# Patient Record
Sex: Female | Born: 1960 | Race: Black or African American | Hispanic: No | Marital: Single | Smoking: Current every day smoker
Health system: Southern US, Community
[De-identification: ages and names within clinical notes are randomized; demographics above are authoritative.]

## PROBLEM LIST (undated history)

## (undated) DIAGNOSIS — M1990 Unspecified osteoarthritis, unspecified site: Secondary | ICD-10-CM

## (undated) DIAGNOSIS — N809 Endometriosis, unspecified: Secondary | ICD-10-CM

## (undated) DIAGNOSIS — D649 Anemia, unspecified: Secondary | ICD-10-CM

## (undated) DIAGNOSIS — I10 Essential (primary) hypertension: Secondary | ICD-10-CM

## (undated) HISTORY — PX: COLONOSCOPY: SHX174

## (undated) HISTORY — PX: CHOLECYSTECTOMY: SHX55

## (undated) HISTORY — PX: ABDOMINAL HYSTERECTOMY: SHX81

## (undated) HISTORY — DX: Endometriosis, unspecified: N80.9

## (undated) HISTORY — DX: Anemia, unspecified: D64.9

## (undated) HISTORY — DX: Unspecified osteoarthritis, unspecified site: M19.90

---

## 1998-05-18 ENCOUNTER — Other Ambulatory Visit: Admission: RE | Admit: 1998-05-18 | Discharge: 1998-05-18 | Payer: Self-pay | Admitting: Gynecology

## 1999-03-22 ENCOUNTER — Other Ambulatory Visit: Admission: RE | Admit: 1999-03-22 | Discharge: 1999-03-22 | Payer: Self-pay | Admitting: Gynecology

## 1999-08-09 ENCOUNTER — Emergency Department (HOSPITAL_COMMUNITY): Admission: EM | Admit: 1999-08-09 | Discharge: 1999-08-09 | Payer: Self-pay | Admitting: Emergency Medicine

## 2000-03-27 ENCOUNTER — Other Ambulatory Visit: Admission: RE | Admit: 2000-03-27 | Discharge: 2000-03-27 | Payer: Self-pay | Admitting: Obstetrics and Gynecology

## 2000-11-26 ENCOUNTER — Encounter (INDEPENDENT_AMBULATORY_CARE_PROVIDER_SITE_OTHER): Payer: Self-pay

## 2000-11-26 ENCOUNTER — Ambulatory Visit (HOSPITAL_COMMUNITY): Admission: RE | Admit: 2000-11-26 | Discharge: 2000-11-26 | Payer: Self-pay | Admitting: Obstetrics and Gynecology

## 2001-04-03 ENCOUNTER — Other Ambulatory Visit: Admission: RE | Admit: 2001-04-03 | Discharge: 2001-04-03 | Payer: Self-pay | Admitting: Obstetrics and Gynecology

## 2001-05-08 ENCOUNTER — Encounter (INDEPENDENT_AMBULATORY_CARE_PROVIDER_SITE_OTHER): Payer: Self-pay | Admitting: Specialist

## 2001-05-08 ENCOUNTER — Inpatient Hospital Stay (HOSPITAL_COMMUNITY): Admission: RE | Admit: 2001-05-08 | Discharge: 2001-05-10 | Payer: Self-pay | Admitting: Obstetrics and Gynecology

## 2005-08-23 ENCOUNTER — Ambulatory Visit: Payer: Self-pay | Admitting: Gastroenterology

## 2005-09-03 ENCOUNTER — Ambulatory Visit: Payer: Self-pay | Admitting: Gastroenterology

## 2005-09-03 ENCOUNTER — Encounter (INDEPENDENT_AMBULATORY_CARE_PROVIDER_SITE_OTHER): Payer: Self-pay | Admitting: *Deleted

## 2006-04-23 ENCOUNTER — Emergency Department (HOSPITAL_COMMUNITY): Admission: EM | Admit: 2006-04-23 | Discharge: 2006-04-23 | Payer: Self-pay | Admitting: Emergency Medicine

## 2006-09-03 ENCOUNTER — Emergency Department (HOSPITAL_COMMUNITY): Admission: EM | Admit: 2006-09-03 | Discharge: 2006-09-03 | Payer: Self-pay | Admitting: Emergency Medicine

## 2006-11-13 ENCOUNTER — Emergency Department (HOSPITAL_COMMUNITY): Admission: EM | Admit: 2006-11-13 | Discharge: 2006-11-13 | Payer: Self-pay | Admitting: Family Medicine

## 2007-02-18 ENCOUNTER — Other Ambulatory Visit: Admission: RE | Admit: 2007-02-18 | Discharge: 2007-02-18 | Payer: Self-pay | Admitting: Obstetrics and Gynecology

## 2007-02-24 ENCOUNTER — Encounter: Admission: RE | Admit: 2007-02-24 | Discharge: 2007-02-24 | Payer: Self-pay | Admitting: Obstetrics and Gynecology

## 2007-03-05 ENCOUNTER — Encounter: Admission: RE | Admit: 2007-03-05 | Discharge: 2007-03-05 | Payer: Self-pay | Admitting: Obstetrics and Gynecology

## 2008-07-26 ENCOUNTER — Encounter (INDEPENDENT_AMBULATORY_CARE_PROVIDER_SITE_OTHER): Payer: Self-pay | Admitting: Emergency Medicine

## 2008-07-26 ENCOUNTER — Emergency Department (HOSPITAL_COMMUNITY): Admission: EM | Admit: 2008-07-26 | Discharge: 2008-07-26 | Payer: Self-pay | Admitting: Emergency Medicine

## 2008-07-26 ENCOUNTER — Ambulatory Visit (HOSPITAL_COMMUNITY): Admission: RE | Admit: 2008-07-26 | Discharge: 2008-07-26 | Payer: Self-pay | Admitting: Emergency Medicine

## 2008-07-26 ENCOUNTER — Ambulatory Visit: Payer: Self-pay | Admitting: Vascular Surgery

## 2008-08-24 ENCOUNTER — Encounter: Admission: RE | Admit: 2008-08-24 | Discharge: 2008-08-24 | Payer: Self-pay | Admitting: Obstetrics and Gynecology

## 2008-08-30 ENCOUNTER — Emergency Department (HOSPITAL_COMMUNITY): Admission: EM | Admit: 2008-08-30 | Discharge: 2008-08-30 | Payer: Self-pay | Admitting: Emergency Medicine

## 2008-08-30 ENCOUNTER — Other Ambulatory Visit: Admission: RE | Admit: 2008-08-30 | Discharge: 2008-08-30 | Payer: Self-pay | Admitting: Obstetrics and Gynecology

## 2009-08-03 ENCOUNTER — Encounter (INDEPENDENT_AMBULATORY_CARE_PROVIDER_SITE_OTHER): Payer: Self-pay | Admitting: *Deleted

## 2009-11-21 ENCOUNTER — Encounter (INDEPENDENT_AMBULATORY_CARE_PROVIDER_SITE_OTHER): Payer: Self-pay | Admitting: *Deleted

## 2009-11-22 ENCOUNTER — Ambulatory Visit: Payer: Self-pay | Admitting: Internal Medicine

## 2009-12-06 ENCOUNTER — Ambulatory Visit: Payer: Self-pay | Admitting: Internal Medicine

## 2010-10-28 ENCOUNTER — Encounter: Payer: Self-pay | Admitting: Obstetrics and Gynecology

## 2010-10-29 ENCOUNTER — Encounter: Payer: Self-pay | Admitting: Obstetrics and Gynecology

## 2010-11-06 NOTE — Procedures (Signed)
Summary: Colonoscopy  Patient: Yvette Davis Note: All result statuses are Final unless otherwise noted.  Tests: (1) Colonoscopy (COL)   COL Colonoscopy           DONE     [facility]     [address]     [place], [place]  [address]           COLONOSCOPY PROCEDURE REPORT           PATIENT:  Yvette Davis, Yvette Davis  MR#:  [id number]     BIRTHDATE:  1961-05-27, 48 yrs. old  GENDER:  female           ENDOSCOPIST:  Iva Boop, MD, [facility]     Referred by:  Surveillance Program Recall,           PROCEDURE DATE:  12/06/2009     PROCEDURE:  Colonoscopy with snare polypectomy     ASA CLASS:  Class II     INDICATIONS:  history of pre-cancerous (adenomatous) colon polyps,     screening, family history of colon cancer 2005: advanced adenomas     (2-3)and endometriosis (polyp)     2006 no adenomas (diminutive hyperplastic polyps)           maternal aunt and uncle with colon cancer           MEDICATIONS:   Fentanyl 50 mcg IV, Versed 5 mg IV           DESCRIPTION OF PROCEDURE:   After the risks benefits and     alternatives of the procedure were thoroughly explained, informed     consent was obtained.  Digital rectal exam was performed and     revealed no abnormalities.   The LB CF-H180AL [id number] endoscope     was introduced through the anus and advanced to the cecum, which     was identified by both the appendix and ileocecal valve, without     limitations.  The quality of the prep was excellent, using     MoviPrep.  The instrument was then slowly withdrawn as the colon     was fully examined.     Insertion: 2:45 minutes withdrawal: 12:38 minutes     <<PROCEDUREIMAGES>>           FINDINGS:  A sessile polyp was found in the sigmoid colon. It was     8 mm in size. Firm polyp. Polyp was snared, then cauterized with     monopolar cautery. Retrieval was successful. snare polyp  A     diminutive polyp was found in the rectum. Polyp was snared without     cautery. Retrieval was  successful. snare polyp  Mild     diverticulosis was found throughout the colon.  This was otherwise     a normal examination of the colon. Right colon retroflex also     normal.   Retroflexed views in the rectum revealed no     abnormalities.    The scope was then withdrawn from the patient     and the procedure completed.           COMPLICATIONS:  None           ENDOSCOPIC IMPRESSION:     1) 8 mm sessile polyp in the sigmoid colon - removed     2) Diminutive polyp in the rectum - removed     3) Mild diverticulosis throughout the colon     4) Otherwise normal examination,excellent prep  5) Prior advanced adenomas 2005 and family hx of colon cancer     (aunt and uncle - maternal)           RECOMMENDATIONS: Take Align 1 each day for gas, bloating. Take for     1 month and stop. restart if it was effective and symproms recur.     schedule GI office visit with Dr. Leone Payor if needed.           REPEAT EXAM:  In for Colonoscopy, pending biopsy results.           Iva Boop, MD, [facility]           CC:  Georgann Housekeeper MD     Artist Pais MD     The Patient           n.     Rosalie Doctor:   Iva Boop at 12/06/2009 09:21 AM           Page 2 of 3   Yvette Davis, Yvette Davis, [id number]  Note: An exclamation mark (!) indicates a result that was not dispersed into the flowsheet. Document Creation Date: 12/06/2009 9:22 AM _______________________________________________________________________  (1) Order result status: Final Collection or observation date-time: 12/06/2009 09:07 Requested date-time:  Receipt date-time:  Reported date-time:  Referring Physician:   Ordering Physician: Stan Head [id number]) Specimen Source:  Source: Launa Grill Order Number: [id number] Lab site:   Appended Document: Colonoscopy     Procedures Next Due Date:    Colonoscopy: 12/2014

## 2010-11-06 NOTE — Letter (Signed)
Summary: [facility] Instructions  [facility]  [address] [place], [place] [address]   Phone: [phone]  Fax: [phone]       Yvette Davis    June 28, 1961    MRN: [id number]        Procedure Day Dorna Bloom: Wednesday 12/06/2009     Arrival Time: 7:30 am      Procedure Time: 8:30 am     Location of Procedure:                    _x _  [facility] (4th Floor)                        PREPARATION FOR COLONOSCOPY WITH MOVIPREP   Starting 5 days prior to your procedure Friday 2/25 do not eat nuts, seeds, popcorn, corn, beans, peas,  salads, or any raw vegetables.  Do not take any fiber supplements (e.g. Metamucil, Citrucel, and Benefiber).  THE DAY BEFORE YOUR PROCEDURE         DATE: Tuesday 3/1  1.  Drink clear liquids the entire day-NO SOLID FOOD  2.  Do not drink anything colored red or purple.  Avoid juices with pulp.  No orange juice.  3.  Drink at least 64 oz. (8 glasses) of fluid/clear liquids during the day to prevent dehydration and help the prep work efficiently.  CLEAR LIQUIDS INCLUDE: Water Jello Ice Popsicles Tea (sugar ok, no milk/cream) Powdered fruit flavored drinks Coffee (sugar ok, no milk/cream) Gatorade Juice: apple, white grape, white cranberry  Lemonade Clear bullion, consomm, broth Carbonated beverages (any kind) Strained chicken noodle soup Hard Candy                             4.  In the morning, mix first dose of MoviPrep solution:    Empty 1 Pouch A and 1 Pouch B into the disposable container    Add lukewarm drinking water to the top line of the container. Mix to dissolve    Refrigerate (mixed solution should be used within 24 hrs)  5.  Begin drinking the prep at 5:00 p.m. The MoviPrep container is divided by 4 marks.   Every 15 minutes drink the solution down to the next mark (approximately 8 oz) until the full liter is complete.   6.  Follow completed prep with 16 oz of clear liquid of your choice (Nothing  red or purple).  Continue to drink clear liquids until bedtime.  7.  Before going to bed, mix second dose of MoviPrep solution:    Empty 1 Pouch A and 1 Pouch B into the disposable container    Add lukewarm drinking water to the top line of the container. Mix to dissolve    Refrigerate  THE DAY OF YOUR PROCEDURE      DATE: Wednesday 3/2  Beginning at3:30 a.m. (5 hours before procedure):         1. Every 15 minutes, drink the solution down to the next mark (approx 8 oz) until the full liter is complete.  2. Follow completed prep with 16 oz. of clear liquid of your choice.    3. You may drink clear liquids until 6:30 am (2 HOURS BEFORE PROCEDURE).   MEDICATION INSTRUCTIONS  Unless otherwise instructed, you should take regular prescription medications with a small sip of water   as early as possible the morning of your procedure.  Diabetic patients - see separate instructions.  Stop taking Plavix or Aggrenox on  _  _  (7 days before procedure).     Stop taking Coumadin on  _ _  (5 days before procedure).  Additional medication instructions: _         OTHER INSTRUCTIONS  You will need a responsible adult at least 51 years of age to accompany you and drive you home.   This person must remain in the waiting room during your procedure.  Wear loose fitting clothing that is easily removed.  Leave jewelry and other valuables at home.  However, you may wish to bring a book to read or  an iPod/MP3 player to listen to music as you wait for your procedure to start.  Remove all body piercing jewelry and leave at home.  Total time from sign-in until discharge is approximately 2-3 hours.  You should go home directly after your procedure and rest.  You can resume normal activities the  day after your procedure.  The day of your procedure you should not:   Drive   Make legal decisions   Operate machinery   Drink alcohol   Return to work  You will receive specific  instructions about eating, activities and medications before you leave.    The above instructions have been reviewed and explained to me by   Ezra Sites RN  November 22, 2009 4:41 PM     I fully understand and can verbalize these instructions _____________________________ Date _________

## 2010-11-06 NOTE — Miscellaneous (Signed)
Summary: [facility]  Clinical Lists Changes  Medications: Added new medication of MOVIPREP 100 GM  SOLR (PEG-KCL-NACL-NASULF-NA ASC-C) As per prep instructions. - Signed Rx of MOVIPREP 100 GM  SOLR (PEG-KCL-NACL-NASULF-NA ASC-C) As per prep instructions.;  #1 x 0;  Signed;  Entered by: Ezra Sites RN;  Authorized by: Iva Boop MD, [facility];  Method used: Electronically to [facility] [id number] [place], [place], [place]  [address], Ph: [phone], Fax: [phone] Allergies: Added new allergy or adverse reaction of PCN Added new allergy or adverse reaction of DEMEROL Added new allergy or adverse reaction of MORPHINE Added new allergy or adverse reaction of SULFA Observations: Added new observation of NKA: F (11/22/2009 16:22)    Prescriptions: MOVIPREP 100 GM  SOLR (PEG-KCL-NACL-NASULF-NA ASC-C) As per prep instructions.  #1 x 0   Entered by:   Ezra Sites RN   Authorized by:   Iva Boop MD, [facility]   Signed by:   Ezra Sites RN on 11/22/2009   Method used:   Electronically to        [facility] [id number] (retail)       [address] [place], [place]  [address]       Ph: [phone]       Fax: [phone]   RxID:   [id number]

## 2011-02-22 NOTE — Op Note (Signed)
[facility]  Patient:    Yvette Davis, Yvette Davis                   MRN: [id number] Proc. Date: 05/08/01 Adm. Date:  [id number] Attending:  Miguel Aschoff                           Operative Report  PREOPERATIVE DIAGNOSIS:       Menorrhagia.  POSTOPERATIVE DIAGNOSIS:      Menorrhagia.  OPERATION:                    Total vaginal hysterectomy.  SURGEON:                      Miguel Aschoff, M.D.  ASSISTANT:                    Luvenia Redden, M.D.  ANESTHESIA:                   General.  COMPLICATIONS:                None.  JUSTIFICATION:                The patient is a 50 year old black female with a history of very heavy menses.  The patient underwent dilatation and curettage and hysteroscopy in February 2002 in effort to reduce the volume of her bleeding.  This did not have any significant affect, and she presents now to undergo definitive surgery via total vaginal hysterectomy.  The risks and benefits of the procedure have been discussed with the patient.  DESCRIPTION OF PROCEDURE:     The patient was taken to the operating room and placed in the supine position.  General anesthesia was administered without difficulty.  She was then placed in the dorsolithotomy position and prepped and draped in the usual sterile fashion.  The bladder was catheterized and drained.  At this point, a weighted speculum was placed in the vaginal vault. The paracervical mucosa was then injected with 1% Xylocaine with epinephrine for hemostasis.  The vaginal mucosa was then circumscribed, and dissection was carried out anteriorly and posteriorly until the peritoneal reflections could be found.  The peritoneum was then entered posteriorly without difficulty.  At this point, the uterosacral ligaments were identified, clamped with curved Heaney clamps.  The pedicles were cut and suture ligated using suture ligatures of 0 Vicryl.  The cardinal ligaments were then clamped, cut,  and suture ligated using suture ligatures of 0 Vicryl.  Additional bites were then taken of the paracervical fascia with the pedicles being cut and suture ligated using suture ligatures of 0 Vicryl.  The peritoneum was then entered anteriorly, and the uterine vessels were identified, clamped with curved Heaney clamps, and these vessels were clamped, cut, and suture ligated with 0 Vicryl sutures.  Additional bites were then taken bilaterally of the broad ligament structures.  All pedicles were suture ligated using suture ligatures of 0 Vicryl.  It was then possible to deliver the fundus through the cul-de-sac.  The residual tissues including the utero-ovarian ligament, fallopian tube, and residual portion of the broad ligament were then clamped with curved Heaney clamps.  These pedicles were cut and then doubly ligated using suture ligatures of 0 Vicryl and then free ties of 0 Vicryl.  These pedicles were then held.  The posterior cuff was then run using running interlocking  0 Vicryl suture.  At this point, lap counts were taken and found to be correct.  Pedicles were inspected.  Hemostasis appeared to be excellent. The only bleeder noted was on the cuff which was going to be closed.  At this point, lap counts were again taken and found to be correct.  Foley catheter was inserted.  Clear urine was obtained, and the vaginal cuff was closed using running interlocking 0 Vicryl sutures.  At this point, a iodoform pack was placed, and the procedure was completed.  The patient was taken out of the lithotomy position and brought to the recovery room in satisfactory condition. The estimated blood loss was approximately 200 cc.  The patient tolerated the procedure well. DD:  05/08/01 TD:  05/09/01 Job: [id number]

## 2011-02-22 NOTE — H&P (Signed)
[facility]  Patient:    Yvette Davis, Yvette Davis                       MRN: [id number] Adm. Date:  05/08/01 Attending:  Miguel Aschoff, M.D.                         History and Physical  ADMISSION DIAGNOSIS:          Menorrhagia refractory to medical therapy.  BRIEF HISTORY:                The patient is a 50 year old black female gravida 6 para 3-0-3-3 who has had a history of very heavy menstrual cycles. Efforts were made to control her heavy bleeding in February 2002 at which time the patient underwent dilatation and curettage.  This procedure had very little if any effect on the amount of her vaginal bleeding.  The pathology report on that revealed secretory endometrium associated with exogenous progestational effect.  Because of poor response to the [facility] and her desire to end her heavy vaginal bleeding, the patient wanted a definite procedure be carried out and requested that total vaginal hysterectomy be performed.  She is therefore being admitted to the hospital at this time to undergo vaginal hysterectomy.  The patients last Pap smear June 2001 was satisfactory and within normal limits.  The patient denies any nausea, vomiting, diarrhea, melena, or hematochezia.  She denies dysuria, frequency, urgency, or any symptoms of stress incontinence.  PAST MEDICAL HISTORY:         The patient has had gallbladder surgery in 1988, cone biopsy in 1997, drainage of breast abscess in 1995, and dilatation and curettage in February 2002.  She denies any history of diabetes, heart disease, asthma, or high blood pressure.  SOCIAL HISTORY:               The patient has three children.  She smokes three-fourths of a pack of cigarettes per day.  She denies use of alcohol or drugs.  FAMILY HISTORY:               Noncontributory.  REVIEW OF SYSTEMS:            HEENT:  Negative.  CARDIORESPIRATORY:  Negative for shortness of breath, cough, or hemoptysis.  GI:  See present  illness.  GU: See present illness.  GYN:  See present illness.  NEUROMUSCULAR:  Negative.  PHYSICAL EXAMINATION:  GENERAL:                      The patient is a well-developed, well-nourished black female in no acute distress.  VITAL SIGNS:                  Temperature 98, pulse 89, respirations 20, blood pressure 143/87.  Weight 250 pounds, height 5 feet 4 inches.  HEENT:                        Head is normocephalic and atraumatic.  Eyes show pupils to be equal and reactive to light and accommodation.  Sclerae white, conjunctivae pink.  Extraocular muscles with full range of motion.  Nose patent without gross lesion.  Tongue is moist and midline.  NECK:                         Supple.  There is no  adenopathy or jugular venous distention noted.  No thyromegaly is noted.  HEART:                        Reveals a regular rhythm with no murmur or gallop.  BREAST:                       Reveals a small cyst just adjacent to the left nipple in the skin.  It appears to be a sebaceous cyst.  ABDOMEN:                      Obese.  There is a right subcostal well-healed incision.  There is no evidence of enlargement of the liver, kidneys, or spleen.  Bowel sounds are active.  There is no rebound or guarding or tenderness noted.  BACK:                         Reveals no CVA tenderness or deformity.  PELVIC:                       Reveals normal external genitalia, normal Bartholins and skenes glands.  The urethra is without gross lesion.  There is slight descent of urethra.  The cervix is well-healed from a prior cone biopsy.  The uterus is retroflexed, abnormal in size.  The adnexa reveal no definite masses.  Rectovaginal exam is confirmatory.  EXTREMITIES:                  Reveal no cyanosis, clubbing, or edema.  SKIN:                         Without gross lesion.  NEUROLOGIC:                   Intact.  IMPRESSION:                    Menorrhagia refractory to medical therapy  and minor surgical therapy.  The patient presents now to undergo total vaginal hysterectomy as definite treatment for her menorrhagia.DD:  05/08/01 TD:  05/08/01 Job: [id number]

## 2011-02-22 NOTE — Op Note (Signed)
[facility]  Patient:    Yvette Davis, Yvette Davis                   MRN: [id number] Proc. Date: 11/08/00 Adm. Date:  [id number] Attending:  Miguel Aschoff                           Operative Report  PREOPERATIVE DIAGNOSIS:       Menometrorrhagia.  POSTOPERATIVE DIAGNOSIS:      Menometrorrhagia.  OPERATION:                    Dilatation and curettage.  SURGEON:                      Miguel Aschoff, M.D.  ANESTHESIA:                   IV sedation with paracervical block.  ESTIMATED BLOOD LOSS:         Minimal.  COMPLICATIONS:                None.  INDICATIONS:                  The patient is a 50 year old black female with history of heavy, irregular vaginal bleeding unresponsive to medical therapy. Attempt was made to control the bleeding with the patient having been on Depo-Provera with Estradiol, however, this did not control the flow and she continued to have heavy, irregular bleeding, passing large clots and she presents now to undergo dilatation and curettage as both a diagnostic and therapeutic procedure to arrest the bleeding.  DESCRIPTION OF PROCEDURE:     The patient was taken to the operating room, placed in a supine position and general anesthesia was administered without difficulty. After this was done, she was prepped and draped in the usual sterile fashion. Examination was carried out which revealed the cervix to be without gross lesion. The vaginal cuff was without gross lesion. The uterus appeared to be anterior and normal size and shape. The adnexa were nontender and no masses were noted.  A speculum was placed in the vaginal vault and the anterior cervical lip was grasped with a tenaculum and then 20 cc of 1% Xylocaine were injected into the cervix by placing 6.5 cc at the 12, 4 and 8 oclock positions. After this was done, the endocervical canal was curetted using a Kevorkian curet. A small amount of tissue was obtained and then using  serial Pratt dilators, the endocervical canal was dilated to a #25 Shawnie Pons could be passed. After this was done a medium sized serrated curet was used and the cavity was vigorously curetted in an effort to arrest the bleeding. The cavity felt somewhat irregular and only a small amount of tissue was obtained which would be something consistent with the patient having been on Depo-Provera. At this point with no other abnormalities being noted and the cavity felt to be thoroughly explored, the procedure was completed. The instruments were removed. The patient was taken out of the lithotomy position and brought to the recovery room in satisfactory condition. The estimated blood loss was approximately 40 cc. The patient tolerated the procedure well.  DISPOSITION:  Plans for the patient to be discharged home. Medications for home include doxycycline 100 mg twice a day x 3 days and Darvocet-N 100, one every 4-6h. as needed for pain. She was instructed to place nothing per vagina  for two weeks and to call for her tissue report on November 28, 2000 and if there are any problems such as fever, pain or heavy bleeding to call us. She is instructed to place no tampons in the vagina for two weeks. The patient will be seen back for a follow-up examination in four weeks. DD:  11/26/00 TD:  11/27/00 Job: [id number]

## 2011-02-22 NOTE — Discharge Summary (Signed)
[facility]  Patient:    Yvette Davis, Yvette Davis Visit Number: [id number] MRN: [id number]          Service Type: GYN Location: [id number] Attending Physician:  Miguel Aschoff Adm. Date:  05/08/2001 Disc. Date: 05/10/2001                             Discharge Summary  ADMISSION DIAGNOSIS:          Menorrhagia refractory to medical therapy.  FINAL DIAGNOSIS:              1. Menorrhagia refractory to medical therapy.                               2. Adenomyosis.                               3. Subserosal leiomyoma.  OPERATIONS AND PROCEDURES:    1. Total vaginal hysterectomy.                               2. General anesthesia.  BRIEF HISTORY:                The patient is a 50 year old black female, gravida 6, para 3-0-3-3, with a long history of very heavy menses which have been refractory to medical therapy and which did not respond to a dilatation and curettage performed in February 2002. Because of the bleeding, the patient has requested that a definitive procedure be carried out to resolve this and requested that hysterectomy be performed. The risks and benefits of the procedure were discussed with the patient and the patient was admitted to the hospital on August 2 to undergo this procedure. Preoperative studies included an admission hemoglobin of 12.7, hematocrit 39.3, white count was 9800. PT and PTT were within normal limits. Chemistry profile revealed slight elevation of the total protein at 9.0, AST at 48 and the ALT at 47.  HOSPITAL COURSE:              Under general anesthesia, total vaginal hysterectomy was carried out without difficulty on August 2. The patients postoperative course was uneventful. She tolerated increasing ambulation and diet well. Her hemoglobin did remain stable with a nadir of 10.8. By August 4 she is in satisfactory condition and could be sent home.  DISCHARGE MEDICATIONS:        Tylox one every three to four  hours as needed for pain.  DISCHARGE INSTRUCTIONS:       She was instructed on no heavy lifting and to place nothing in the vagina and to call if there are any problems such as fever, pain, or heavy bleeding.  DISCHARGE FOLLOWUP:           The patients postoperative visit is scheduled for four weeks.  FINAL PATHOLOGY REPORT:       Uterus: 138 g uterus with hyperglandular endocervical hyperplasia, benign proliferative endometrium with adenomyosis, and subserosal leiomyoma. Attending Physician:  Miguel Aschoff DD:  05/27/01 TD:  05/27/01 Job: [id number]

## 2011-07-09 LAB — POCT I-STAT, CHEM 8
BUN: 6 mg/dL (ref 6–23)
Calcium, Ion: 1.13 mmol/L (ref 1.12–1.32)
Chloride: 105 mEq/L (ref 96–112)
HCT: 40 % (ref 36.0–46.0)
Hemoglobin: 13.6 g/dL (ref 12.0–15.0)

## 2011-08-20 ENCOUNTER — Encounter: Payer: Self-pay | Admitting: Emergency Medicine

## 2011-08-20 ENCOUNTER — Emergency Department (HOSPITAL_COMMUNITY): Payer: BC Managed Care – PPO

## 2011-08-20 ENCOUNTER — Emergency Department (HOSPITAL_COMMUNITY)
Admission: EM | Admit: 2011-08-20 | Discharge: 2011-08-21 | Disposition: A | Discharge status: Home / Self Care | Payer: BC Managed Care – PPO | Attending: Emergency Medicine | Admitting: Emergency Medicine

## 2011-08-20 DIAGNOSIS — W010XXA Fall on same level from slipping, tripping and stumbling without subsequent striking against object, initial encounter: Secondary | ICD-10-CM | POA: Insufficient documentation

## 2011-08-20 DIAGNOSIS — S42293A Other displaced fracture of upper end of unspecified humerus, initial encounter for closed fracture: Secondary | ICD-10-CM | POA: Insufficient documentation

## 2011-08-20 DIAGNOSIS — M25519 Pain in unspecified shoulder: Secondary | ICD-10-CM | POA: Insufficient documentation

## 2011-08-20 DIAGNOSIS — R209 Unspecified disturbances of skin sensation: Secondary | ICD-10-CM | POA: Insufficient documentation

## 2011-08-20 NOTE — ED Notes (Signed)
Pt st's she fell earlier tonight and landed on right shoulder.  Pt st's unable to lift arm up.  + radial pulse present

## 2011-08-21 MED ORDER — OXYCODONE-ACETAMINOPHEN 5-325 MG PO TABS
1.0000 | ORAL_TABLET | Freq: Once | ORAL | Status: AC
  Administered 2011-08-21: 1 via ORAL
  Filled 2011-08-21: qty 1

## 2011-08-21 MED ORDER — PERCOCET 5-325 MG PO TABS: 1.0000 | ORAL_TABLET | Freq: Four times a day (QID) | ORAL | Status: AC | PRN

## 2011-08-21 NOTE — ED Notes (Signed)
Ortho paged for sling placement.

## 2011-08-21 NOTE — ED Provider Notes (Signed)
Medical screening examination/treatment/procedure(s) were performed by non-physician practitioner and as supervising physician I was immediately available for consultation/collaboration.  Olivia Mackie, MD 08/21/11 [phone]

## 2011-08-21 NOTE — ED Provider Notes (Signed)
History     CSN: [id number] Arrival date & time: 08/20/2011  9:50 PM   First MD Initiated Contact with Patient 08/21/11 0033      No chief complaint on file.   (Consider location/radiation/quality/duration/timing/severity/associated sxs/prior treatment) HPI Comments: Patient reports trip and fall onto outstretched right hand.  Reports pain in right shoulder, tingling in 3rd-5th digits, inability to raise arm.  Denies hitting head, LOC, other injury.    The history is provided by the patient.    History reviewed. No pertinent past medical history.  Past Surgical History  Procedure Date  . Cholecystectomy     No family history on file.  History  Substance Use Topics  . Smoking status: Current Everyday Smoker -- 0.5 packs/day for 31 years  . Smokeless tobacco: Never Used  . Alcohol Use: Yes     Drinks beer now and then    OB History    Grav Para Term Preterm Abortions TAB SAB Ect Mult Living                  Review of Systems  All other systems reviewed and are negative.    Allergies  Meperidine hcl; Morphine; Penicillins; and Sulfonamide derivatives  Home Medications   Current Outpatient Rx  Name Route Sig Dispense Refill  . AMLODIPINE BESYLATE 5 MG PO TABS Oral Take 5 mg by mouth daily.      Marland Kitchen METOPROLOL TARTRATE 25 MG PO TABS Oral Take 25 mg by mouth 2 (two) times daily.        BP 128/81  Pulse 70  Temp(Src) 97.8 F (36.6 C) (Oral)  Resp 19  SpO2 99%  Physical Exam  Constitutional: She is oriented to person, place, and time. She appears well-developed and well-nourished.  HENT:  Head: Normocephalic and atraumatic.  Neck: Neck supple.  Pulmonary/Chest: Effort normal.  Musculoskeletal:       RUE:  Grip strengths equal, distal pulses intact.  Proximal humerus ttp, clavical, AC joint nontender.  Pt will not raise arm.  Able to move wrist in all directions.  Wrist and elbow nontender to palpation.    Neurological: She is alert and oriented to person,  place, and time.    ED Course  Procedures (including critical care time)  Labs Reviewed - No data to display Dg Shoulder Right  08/20/2011  *RADIOLOGY REPORT*  Clinical Data: Status post fall on right shoulder; right shoulder pain.  RIGHT SHOULDER - 2+ VIEW  Comparison: None.  Findings: There appears to be a slightly comminuted fracture involving the medial aspect of the humeral head, extending to the surgical neck of the humerus.  This is difficult to fully characterize due to multiple associated osteophytes.  The humeral head remains seated at the glenoid fossa.  Mild degenerative change is noted at the right acromioclavicular joint. The right lung appears grossly clear.  No significant soft tissue abnormalities are characterized on radiograph.  IMPRESSION: Apparent slightly comminuted fracture involving the medial aspect of the humeral head, extending to the surgical neck of the humerus. No evidence of dislocation.  Original Report Authenticated By: Tonia Ghent, M.D.   Discussed patient and reviewed xray and plan with Dr Norlene Campbell.    1. Humeral head fracture       MDM  Patient with [facility], has orthopedist for follow up, placed in sling by ortho tech.         Dillard Cannon [place], [place] 08/21/11 [phone]

## 2012-05-12 ENCOUNTER — Other Ambulatory Visit: Payer: Self-pay | Admitting: Internal Medicine

## 2012-05-12 DIAGNOSIS — Z1231 Encounter for screening mammogram for malignant neoplasm of breast: Secondary | ICD-10-CM

## 2012-05-18 ENCOUNTER — Ambulatory Visit: Payer: BC Managed Care – PPO

## 2014-12-25 ENCOUNTER — Encounter: Payer: Self-pay | Admitting: Internal Medicine

## 2014-12-26 ENCOUNTER — Encounter: Payer: Self-pay | Admitting: Internal Medicine

## 2016-05-07 ENCOUNTER — Encounter: Payer: Self-pay | Admitting: Internal Medicine

## 2016-07-10 ENCOUNTER — Encounter: Payer: Self-pay | Admitting: Internal Medicine

## 2016-08-13 ENCOUNTER — Encounter: Payer: Self-pay | Admitting: Gastroenterology

## 2016-08-27 ENCOUNTER — Emergency Department (HOSPITAL_COMMUNITY): Payer: Managed Care, Other (non HMO)

## 2016-08-27 ENCOUNTER — Emergency Department (HOSPITAL_COMMUNITY)
Admission: EM | Admit: 2016-08-27 | Discharge: 2016-08-28 | Disposition: A | Discharge status: Home / Self Care | Payer: Managed Care, Other (non HMO) | Attending: Emergency Medicine | Admitting: Emergency Medicine

## 2016-08-27 ENCOUNTER — Encounter (HOSPITAL_COMMUNITY): Payer: Self-pay | Admitting: Emergency Medicine

## 2016-08-27 DIAGNOSIS — F17200 Nicotine dependence, unspecified, uncomplicated: Secondary | ICD-10-CM | POA: Insufficient documentation

## 2016-08-27 DIAGNOSIS — R079 Chest pain, unspecified: Secondary | ICD-10-CM | POA: Diagnosis present

## 2016-08-27 DIAGNOSIS — I10 Essential (primary) hypertension: Secondary | ICD-10-CM | POA: Diagnosis not present

## 2016-08-27 DIAGNOSIS — R0689 Other abnormalities of breathing: Secondary | ICD-10-CM | POA: Diagnosis not present

## 2016-08-27 DIAGNOSIS — R0789 Other chest pain: Secondary | ICD-10-CM | POA: Diagnosis not present

## 2016-08-27 HISTORY — DX: Essential (primary) hypertension: I10

## 2016-08-27 LAB — I-STAT TROPONIN, ED: TROPONIN I, POC: 0 ng/mL (ref 0.00–0.08)

## 2016-08-27 NOTE — ED Provider Notes (Signed)
[place] DEPT Provider Note   CSN: [id number] Arrival date & time: 08/27/16  2246     History   Chief Complaint Chief Complaint  Patient presents with  . Chest Pain    HPI Yvette Davis is a 55 y.o. female.  HPI Yvette Davis is a 55 y.o. female with PMH significant for endometriosis and HTN who presents with couple month history of intermittent, non-radiating, left sided chest pain.  She describes it as "my heart is separating from me" and sharp.  It begins suddenly and lasts about 30 minutes.  She denies any triggers and states it happens randomly.  Over the past couple of months she states it's happened about 10 times. The last time it occurred was about 5:30 PM today. She is not experiencing any pain currently. It is not exertional or pleuritic.  No associated fever, cough, shortness of breath, diaphoresis, nausea, vomiting, or abdominal pain.  No personal cardiac history of family history of early CAD.  She smokes 0.5 pack of cigarettes / day.  No hx of DVT/PE, recent surgery/immobilization, or unilateral leg swelling.  Nothing makes her symptoms better or worse.  She states she's been under more stress at work and recently lost a coworker she was very close to.  She states that seems to be when her pain started. She takes metoprolol and amlodipine for her HTN.   Past Medical History:  Diagnosis Date  . Endometriosis   . Hypertension     There are no active problems to display for this patient.   Past Surgical History:  Procedure Laterality Date  . CHOLECYSTECTOMY    . COLONOSCOPY      OB History    No data available       Home Medications    Prior to Admission medications   Medication Sig Start Date End Date Taking? Authorizing Provider  amLODipine (NORVASC) 5 MG tablet Take 5 mg by mouth daily.      Historical Provider, MD  metoprolol tartrate (LOPRESSOR) 25 MG tablet Take 25 mg by mouth 2 (two) times daily.      Historical Provider, MD     Family History No family history on file.  Social History Social History  Substance Use Topics  . Smoking status: Current Every Day Smoker    Packs/day: 0.50    Years: 31.00  . Smokeless tobacco: Never Used  . Alcohol use Yes     Comment: Drinks beer now and then     Allergies   Meperidine hcl; Morphine; Penicillins; and Sulfonamide derivatives   Review of Systems Review of Systems All other systems negative unless otherwise stated in HPI   Physical Exam Updated Vital Signs BP 157/81   Pulse 74   Temp 97.9 F (36.6 C) (Oral)   Resp 19   Ht 5\' 3"  (1.6 m)   Wt 86.2 kg   SpO2 96%   BMI 33.66 kg/m   Physical Exam  Constitutional: She is oriented to person, place, and time. She appears well-developed and well-nourished.  Non-toxic appearance. She does not have a sickly appearance. She does not appear ill.  HENT:  Head: Normocephalic and atraumatic.  Mouth/Throat: Oropharynx is clear and moist.  Eyes: Conjunctivae are normal.  Neck: Normal range of motion. Neck supple.  Cardiovascular: Normal rate, regular rhythm and normal heart sounds.   No lower extremity edema. Lower extremities are symmetric.  Pulmonary/Chest: Effort normal. No accessory muscle usage or stridor. No respiratory distress. She has decreased breath  sounds. She has no wheezes. She has no rhonchi. She has no rales.  Abdominal: Soft. Bowel sounds are normal. She exhibits no distension. There is no tenderness.  Musculoskeletal: Normal range of motion.  Lymphadenopathy:    She has no cervical adenopathy.  Neurological: She is alert and oriented to person, place, and time.  Speech clear without dysarthria.  Skin: Skin is warm and dry.  Psychiatric: She has a normal mood and affect. Her behavior is normal.     ED Treatments / Results  Labs (all labs ordered are listed, but only abnormal results are displayed) Labs Reviewed  BASIC METABOLIC PANEL - Abnormal; Notable for the following:        Result Value   Glucose, Bld 108 (*)    BUN 5 (*)    All other components within normal limits  CBC - Abnormal; Notable for the following:    MCH 24.7 (*)    All other components within normal limits  BRAIN NATRIURETIC PEPTIDE  I-STAT TROPOININ, ED    EKG  EKG Interpretation  Date/Time:  Tuesday August 27 2016 22:52:07 EST Ventricular Rate:  81 PR Interval:    QRS Duration: 87 QT Interval:  403 QTC Calculation: 468 R Axis:   40 Text Interpretation:  Sinus rhythm Left atrial enlargement Abnormal R-wave progression, early transition Artifact No significant change was found Confirmed by Wyvonnia Dusky  MD, STEPHEN [phone]) on 08/27/2016 11:02:18 PM       Radiology Dg Chest 2 View  Result Date: 08/27/2016 CLINICAL DATA:  Chronic intermittent generalized chest pain. Initial encounter. EXAM: CHEST  2 VIEW COMPARISON:  Chest radiograph performed 09/03/2006 FINDINGS: The lungs are well-aerated. Mild vascular congestion is noted. Mild bibasilar atelectasis is seen. There is no evidence of pleural effusion or pneumothorax. The heart is mildly enlarged. No acute osseous abnormalities are seen. Clips are noted within the right upper quadrant, reflecting prior cholecystectomy. IMPRESSION: Mild vascular congestion and mild cardiomegaly. Mild bibasilar atelectasis seen. Electronically Signed   By: Garald Balding M.D.   On: 08/27/2016 23:12    Procedures Procedures (including critical care time)  Medications Ordered in ED Medications - No data to display   Initial Impression / Assessment and Plan / ED Course  I have reviewed the triage vital signs and the nursing notes.  Pertinent labs & imaging results that were available during my care of the patient were reviewed by me and considered in my medical decision making (see chart for details).  Clinical Course    Patient presents with chest pain.  Vitals reassuring.  No pain currently.  Happens intermittently and is not exertional or pleuritic.   Physical exam is as above.  EKG without ischemia.  CXR with mild vascular congestion.  Low suspicion for dissection.  Low risk for PE with Well's criteria. Atypical story for ACS, HEAR score 3.  Will obtain troponin and labs. These are without acute abnormalities.  Plan to delta troponin and repeat EKG at 2:40 AM.  Anticipate discharge home with PCP follow up for stress test within 30 days.    1 AM: Informed by nursing that patient is refusing to stay.  She states she does not need to be here anymore.  We offered her a nicotine patch and she became angry stating "I don't take medications, and I don't do that to my body, and that was rude of you to offer that".  I discussed with her that we have not been able to complete our workup to rule  out emergent causes including death. She understands and acknowledges this.  Patient left before I was able to discuss PCP follow up regarding stress test.  She did not receive discharge paperwork and left AMA.  Case has been discussed with and seen by Dr. Wyvonnia Dusky who agrees with the above plan.  Final Clinical Impressions(s) / ED Diagnoses   Final diagnoses:  Atypical chest pain    New Prescriptions New Prescriptions   No medications on file       Vivien Rossetti 08/28/16 0113    Ezequiel Essex, MD 08/28/16 [phone]

## 2016-08-27 NOTE — ED Triage Notes (Signed)
Per EMS past several months pt had chest pain non radiating. Pt BP is uncontrolled with medications. Pt does not take amlodipine. Pt is under a lot of stress at home and work. Denies headache or dizziness

## 2016-08-28 ENCOUNTER — Ambulatory Visit: Payer: Self-pay | Admitting: Gastroenterology

## 2016-08-28 ENCOUNTER — Encounter: Payer: Self-pay | Admitting: Internal Medicine

## 2016-08-28 LAB — CBC
HCT: 39.9 % (ref 36.0–46.0)
Hemoglobin: 12.6 g/dL (ref 12.0–15.0)
MCH: 24.7 pg — AB (ref 26.0–34.0)
MCHC: 31.6 g/dL (ref 30.0–36.0)
MCV: 78.1 fL (ref 78.0–100.0)
PLATELETS: 224 10*3/uL (ref 150–400)
RBC: 5.11 MIL/uL (ref 3.87–5.11)
RDW: 14.5 % (ref 11.5–15.5)
WBC: 8.1 10*3/uL (ref 4.0–10.5)

## 2016-08-28 LAB — BASIC METABOLIC PANEL
Anion gap: 9 (ref 5–15)
BUN: 5 mg/dL — ABNORMAL LOW (ref 6–20)
CALCIUM: 9 mg/dL (ref 8.9–10.3)
CO2: 25 mmol/L (ref 22–32)
CREATININE: 0.65 mg/dL (ref 0.44–1.00)
Chloride: 108 mmol/L (ref 101–111)
GFR calc non Af Amer: >60 mL/min (ref >60)
Glucose, Bld: 108 mg/dL — ABNORMAL HIGH (ref 65–99)
Potassium: 3.6 mmol/L (ref 3.5–5.1)
SODIUM: 142 mmol/L (ref 135–145)

## 2016-08-28 LAB — BRAIN NATRIURETIC PEPTIDE: B Natriuretic Peptide: 44.7 pg/mL (ref 0.0–100.0)

## 2016-08-28 NOTE — ED Notes (Signed)
Pt angry stating that she feels fine and wants to leave and go smoke a ciggarette

## 2016-08-28 NOTE — ED Provider Notes (Signed)
By signing my name below, I, Doran Stabler, attest that this documentation has been prepared under the direction and in the presence of Ezequiel Essex, MD. Electronically Signed: Doran Stabler, ED Scribe. 08/28/16. 12:38 AM.  HPI Comments: Yvette Davis is a 55 y.o. female who presents to the Emergency Department with a PMHx HTN complaining of intermittent left sided chest pain for the past several months which became longer in duration today at 6 PM. 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 When she came home at 6 PM, she began having chest pain. Since then, her pain has been intermittent with episodes lasting longer than usual. She states her pain feels as if "her heart is separating from her chest." Pt denies any radiation. She currently is asymptomatic. Pt denies any diaphoresis, SOB, N/V/D, abdominal pain, leg swelling or any other symptoms at at this time. Pt denies any cardiac history. Pt smokes.   PCP Hussain   Physical Exam Anxious RRR, CTAB No chest tenderness  Patient with intermittent central chest pain for the past several months. She is evasive with her history and demanding to leave. She states she wants a cigarette and refuses an offer of a nicotine patch. Her EKG is nonischemic. Troponin negative. Heart score is 3. Recommend serial troponins to rule out MI. Doubt aortic dissection, doubt PE. Advised outpatient stress test recommended.  Patient elected to leave [facility] because she wanted to smoke. Unwilling to stay for second troponin. She appears to have capacity to make medical decisions and understands the a life threatening condition has not been ruled out.  Patient requesting to leave against medical advice. He is alert and oriented x3 and appears to have decision making capacity. Denies suicidal or homicidal ideation.  Discussed that admission and further testing is recommended and emergent medical conditions have not been  ruled out. Discussed that leaving against medical advice may result in clinical deterioration and possible death.   I personally performed the services described in this documentation, which was scribed in my presence. The recorded information has been reviewed and is accurate.       Ezequiel Essex, MD 08/28/16 [phone]

## 2016-08-28 NOTE — Discharge Instructions (Signed)
Continue taking your prescribed medications.  Please follow up with your primary care physician.  It is important that you have a stress test within the next 30 days.  Your symptoms may be exacerbated by smoking.  Return to the ED for sudden worsening pain, shortness of breath, passing out, or any new or concerning symptoms.

## 2016-08-28 NOTE — ED Notes (Signed)
Pt states that she does not want to stay any longer to be evaluated for CP, pt states she want a cigarette, explained to pt she would we were waiting for labs to result and PA would be in shortly, PA notified

## 2016-08-28 NOTE — ED Notes (Signed)
Pt states she wants her IV removed and wants to leave AMA, pt states she understands the risk and benefits of not waiting for another troponin level, pt offered nicotine patch, pt became very angry and states that was a rude offer and she is going to report it. Pt states she better not receive a bill for services and she is leaving to go smoke.

## 2016-10-30 ENCOUNTER — Encounter: Payer: Self-pay | Admitting: Internal Medicine

## 2018-06-09 ENCOUNTER — Encounter (HOSPITAL_COMMUNITY): Payer: Self-pay | Admitting: Emergency Medicine

## 2018-06-09 ENCOUNTER — Ambulatory Visit (HOSPITAL_COMMUNITY)
Admission: EM | Admit: 2018-06-09 | Discharge: 2018-06-09 | Disposition: A | Discharge status: Home / Self Care | Payer: 59 | Attending: Family Medicine | Admitting: Family Medicine

## 2018-06-09 DIAGNOSIS — I10 Essential (primary) hypertension: Secondary | ICD-10-CM | POA: Diagnosis not present

## 2018-06-09 DIAGNOSIS — Z760 Encounter for issue of repeat prescription: Secondary | ICD-10-CM | POA: Diagnosis not present

## 2018-06-09 LAB — POCT I-STAT, CHEM 8
BUN: 4 mg/dL — AB (ref 6–20)
CREATININE: 0.5 mg/dL (ref 0.44–1.00)
Calcium, Ion: 1.13 mmol/L — ABNORMAL LOW (ref 1.15–1.40)
Chloride: 103 mmol/L (ref 98–111)
Glucose, Bld: 96 mg/dL (ref 70–99)
HEMATOCRIT: 47 % — AB (ref 36.0–46.0)
Hemoglobin: 16 g/dL — ABNORMAL HIGH (ref 12.0–15.0)
Potassium: 4 mmol/L (ref 3.5–5.1)
SODIUM: 140 mmol/L (ref 135–145)
TCO2: 25 mmol/L (ref 22–32)

## 2018-06-09 MED ORDER — HYDROCHLOROTHIAZIDE 12.5 MG PO TABS: 12.5000 mg | ORAL_TABLET | Freq: Every day | ORAL | 0 refills | Status: DC

## 2018-06-09 MED ORDER — AMLODIPINE BESYLATE 5 MG PO TABS: 5.0000 mg | ORAL_TABLET | Freq: Every day | ORAL | 0 refills | Status: DC

## 2018-06-09 NOTE — ED Triage Notes (Signed)
PT reports she took the last of her BP medicine yesterday. PT requests a refill. PT also reports some slight chest pain which is what made her come in

## 2018-06-09 NOTE — ED Provider Notes (Signed)
[place]    CSN: [id number] Arrival date & time: 06/09/18  0940     History   Chief Complaint Chief Complaint  Patient presents with  . Hypertension    HPI Yvette Davis is a 57 y.o. female.   57 year old female comes in for hypertension and medication refill. Patient states has not seen a provider in "awhile" and ran out of amlodipine yesterday. She was also taking a friends HCTZ, unknown dosage, for her hypertension and stopped 1 week ago. States blood pressure has been in the 180s/100s at home these past few days. Had an episode of chest pain that lasted a few seconds and came in for evaluation. She denies current chest pain, shortness of breath, palpitations. Denies weakness, dizziness, syncope. She is in the process of getting a PCP.      Past Medical History:  Diagnosis Date  . Endometriosis   . Hypertension     There are no active problems to display for this patient.   Past Surgical History:  Procedure Laterality Date  . CHOLECYSTECTOMY    . COLONOSCOPY      OB History   None      Home Medications    Prior to Admission medications   Medication Sig Start Date End Date Taking? Authorizing Provider  amLODipine (NORVASC) 5 MG tablet Take 1 tablet (5 mg total) by mouth daily. 06/09/18   Tasia Catchings, Elivia Robotham V, PA-C  hydrochlorothiazide (HYDRODIURIL) 12.5 MG tablet Take 1 tablet (12.5 mg total) by mouth daily. 06/09/18 08/08/18  Ok Edwards, PA-C    Family History No family history on file.  Social History Social History   Tobacco Use  . Smoking status: Current Every Day Smoker    Packs/day: 0.50    Years: 31.00    Pack years: 15.50  . Smokeless tobacco: Never Used  Substance Use Topics  . Alcohol use: Yes    Comment: Drinks beer now and then  . Drug use: No     Allergies   Meperidine hcl; Morphine; Penicillins; and Sulfonamide derivatives   Review of Systems Review of Systems  Reason unable to perform ROS: See HPI as above.      Physical Exam Triage Vital Signs ED Triage Vitals  Enc Vitals Group     BP 06/09/18 1046 (!) 189/104     Pulse Rate 06/09/18 1046 85     Resp 06/09/18 1046 16     Temp 06/09/18 1046 98.2 F (36.8 C)     Temp Source 06/09/18 1046 Oral     SpO2 06/09/18 1046 100 %     Weight --      Height --      Head Circumference --      Peak Flow --      Pain Score 06/09/18 1048 2     Pain Loc --      Pain Edu? --      Excl. in [place]? --    No data found.  Updated Vital Signs BP (!) 189/104 (BP Location: Right Arm)   Pulse 85   Temp 98.2 F (36.8 C) (Oral)   Resp 16   SpO2 100%   Physical Exam  Constitutional: She is oriented to person, place, and time. She appears well-developed and well-nourished. No distress.  HENT:  Head: Normocephalic and atraumatic.  Eyes: Pupils are equal, round, and reactive to light. Conjunctivae are normal.  Cardiovascular: Normal rate, regular rhythm and normal heart sounds. Exam reveals no  gallop and no friction rub.  No murmur heard. Pulmonary/Chest: Effort normal and breath sounds normal. No accessory muscle usage or stridor. No respiratory distress. She has no decreased breath sounds. She has no wheezes. She has no rhonchi. She has no rales.  Neurological: She is alert and oriented to person, place, and time. She has normal strength. She is not disoriented. No cranial nerve deficit or sensory deficit. She displays a negative Romberg sign. Coordination and gait normal. GCS eye subscore is 4. GCS verbal subscore is 5. GCS motor subscore is 6.  Normal finger to nose, rapid movement.   Skin: She is not diaphoretic.     UC Treatments / Results  Labs (all labs ordered are listed, but only abnormal results are displayed) Labs Reviewed  POCT I-STAT, CHEM 8 - Abnormal; Notable for the following components:      Result Value   BUN 4 (*)    Calcium, Ion 1.13 (*)    Hemoglobin 16.0 (*)    HCT 47.0 (*)    All other components within normal limits     EKG None  Radiology No results found.  Procedures Procedures (including critical care time)  Medications Ordered in UC Medications - No data to display  Initial Impression / Assessment and Plan / UC Course  I have reviewed the triage vital signs and the nursing notes.  Pertinent labs & imaging results that were available during my care of the patient were reviewed by me and considered in my medical decision making (see chart for details).     Normal creatinine. Will refill amlodipine, and switch metoprolol to HCTZ 12.5. She again denied current chest pain. Will have patient start medications as directed, push fluids and monitor BP. Follow up with PCP for further evaluation. Return precautions given. Patient expresses understanding and agrees to plan.  Case discussed with Dr Meda Coffee, who agrees to plan.  Final Clinical Impressions(s) / UC Diagnoses   Final diagnoses:  Hypertension, unspecified type    ED Prescriptions    Medication Sig Dispense Auth. Provider   amLODipine (NORVASC) 5 MG tablet Take 1 tablet (5 mg total) by mouth daily. 60 tablet Frank Pilger V, PA-C   hydrochlorothiazide (HYDRODIURIL) 12.5 MG tablet Take 1 tablet (12.5 mg total) by mouth daily. 60 tablet Tobin Chad, [place] 06/09/18 1323

## 2018-06-09 NOTE — Discharge Instructions (Signed)
Your electrolytes and kidney function was normal. I have refilled amlodipine for 60 days. I have also started you on HCTZ 12.5mg  for 60 days. Please continue to check your blood pressure, if it is increased with chest pain, shortness of breath, weakness, dizziness, go to the emergency department for further evaluation. Otherwise, follow up with PCP for further evaluation and management needed.

## 2018-07-08 ENCOUNTER — Encounter (INDEPENDENT_AMBULATORY_CARE_PROVIDER_SITE_OTHER): Payer: Self-pay | Admitting: Physician Assistant

## 2018-07-08 ENCOUNTER — Ambulatory Visit (INDEPENDENT_AMBULATORY_CARE_PROVIDER_SITE_OTHER): Payer: 59 | Admitting: Physician Assistant

## 2018-07-08 VITALS — BP 167/98 | HR 82 | Temp 98.4°F | Resp 16 | Ht 64.0 in | Wt 203.4 lb

## 2018-07-08 DIAGNOSIS — K089 Disorder of teeth and supporting structures, unspecified: Secondary | ICD-10-CM

## 2018-07-08 DIAGNOSIS — Z1239 Encounter for other screening for malignant neoplasm of breast: Secondary | ICD-10-CM

## 2018-07-08 DIAGNOSIS — M25561 Pain in right knee: Secondary | ICD-10-CM | POA: Diagnosis not present

## 2018-07-08 DIAGNOSIS — M25562 Pain in left knee: Secondary | ICD-10-CM

## 2018-07-08 DIAGNOSIS — I10 Essential (primary) hypertension: Secondary | ICD-10-CM

## 2018-07-08 DIAGNOSIS — F17200 Nicotine dependence, unspecified, uncomplicated: Secondary | ICD-10-CM | POA: Diagnosis not present

## 2018-07-08 DIAGNOSIS — Z1159 Encounter for screening for other viral diseases: Secondary | ICD-10-CM | POA: Diagnosis not present

## 2018-07-08 DIAGNOSIS — G8929 Other chronic pain: Secondary | ICD-10-CM

## 2018-07-08 MED ORDER — DOXYCYCLINE HYCLATE 100 MG PO TABS: 100.0000 mg | ORAL_TABLET | Freq: Two times a day (BID) | ORAL | 0 refills | Status: DC

## 2018-07-08 MED ORDER — HYDROCHLOROTHIAZIDE 25 MG PO TABS
25.0000 mg | ORAL_TABLET | Freq: Every day | ORAL | 1 refills | Status: DC
Start: 2018-07-08 — End: 2019-04-03

## 2018-07-08 MED ORDER — AMLODIPINE BESYLATE 10 MG PO TABS: 10.0000 mg | ORAL_TABLET | Freq: Every day | ORAL | 1 refills | Status: DC

## 2018-07-08 NOTE — Patient Instructions (Signed)
[email or web address]   Please visit [email or web address] to find a dentist.

## 2018-07-08 NOTE — Progress Notes (Signed)
Subjective:  Patient ID: Yvette Davis, female    DOB: 1961/02/17  Age: 57 y.o. MRN: [id number]  CC:  HTN  HPI Yvette Davis is a 57 y.o. female with a medical history of HTN and endometriosis presents as a new patient for management of HTN. Currently taking Amlodipine 5 mg and HCTZ 12.5 mg as directed. BP 167/98 mmHg today in clinic. Does not endorse CP, palpitations, SOB, HA, tingling, numbness, presyncope, syncope, abdominal pain, f/c/n/v, rash, swelling, or GI/GU sxs.      Has been smoking for 38 years. Smokes a pack every two days. Is not interested in quitting as of this visit. Says she does not want to take pills or use patches as there are side effects that she has seen on TV. Generally does not like taking pills unless absolutely necessary.     Pt has chronic pain of both knees. Used to be seen by [facility]. Last received injection to the knees approximately 7 years ago. Feels knee pain is worsening. Tries to stay active and walking about to help relieve pain. Knees "stiffen up" if she sits for too long.       Outpatient Medications Prior to Visit  Medication Sig Dispense Refill  . amLODipine (NORVASC) 5 MG tablet Take 1 tablet (5 mg total) by mouth daily. 60 tablet 0  . hydrochlorothiazide (HYDRODIURIL) 12.5 MG tablet Take 1 tablet (12.5 mg total) by mouth daily. 60 tablet 0   No facility-administered medications prior to visit.      ROS Review of Systems  Constitutional: Negative for chills, fever and malaise/fatigue.  Eyes: Negative for blurred vision.  Respiratory: Negative for shortness of breath.   Cardiovascular: Negative for chest pain and palpitations.  Gastrointestinal: Negative for abdominal pain and nausea.  Genitourinary: Negative for dysuria and hematuria.  Musculoskeletal: Negative for joint pain and myalgias.  Skin: Negative for rash.  Neurological: Negative for tingling and headaches.  Psychiatric/Behavioral: Negative for depression.  The patient is not nervous/anxious.     Objective:   Vitals:   07/08/18 1401 07/08/18 1415  BP: (!) 169/83 (!) 167/98  Pulse: 82   Resp: 16   Temp: 100.1 F (37.8 C) 98.4 F (36.9 C)  TempSrc: Oral Oral  SpO2: 96%   Weight: 203 lb 6.4 oz (92.3 kg)   Height: 5\' 4"  (1.626 m)      Physical Exam  Constitutional: She is oriented to person, place, and time.  Well developed, obese, NAD, polite  HENT:  Head: Normocephalic and atraumatic.  Eyes: No scleral icterus.  Neck: Normal range of motion. Neck supple. No thyromegaly present.  Cardiovascular: Normal rate, regular rhythm and normal heart sounds.  No LE edema bilaterally  Pulmonary/Chest: Effort normal and breath sounds normal.  Musculoskeletal: She exhibits no edema.  Bony hypertrophy of the knee bilaterally. Full aROM of the knee bilaterally. Negative anterior/posterior drawer, negative varus/valgus, negative patellar apprehension.  Neurological: She is alert and oriented to person, place, and time.  Skin: Skin is warm and dry. No rash noted. No erythema. No pallor.  Psychiatric: She has a normal mood and affect. Her behavior is normal. Thought content normal.  Vitals reviewed.    Assessment & Plan:   1. Essential hypertension - Increase hydrochlorothiazide (HYDRODIURIL) 25 MG tablet; Take 1 tablet (25 mg total) by mouth daily.  Dispense: 90 tablet; Refill: 1 - Increase amLODipine (NORVASC) 10 MG tablet; Take 1 tablet (10 mg total) by mouth daily.  Dispense: 90 tablet;  Refill: 1 - Lipid panel; Future - Comprehensive metabolic panel; Future - TSH; Future - CBC with Differential; Future  2. Tobacco use disorder - Pt declines treatment  3. Chronic pain of both knees - DG Knee Complete 4 Views Right; Future - DG Knee Complete 4 Views Left; Future  4. Need for hepatitis C screening test - Hepatitis c antibody (reflex); Future  5. Screening for breast cancer - MM DIGITAL SCREENING BILATERAL; Future  6. Poor  dentition - I have advised patient to go to [email or web address] and find a dentist that is near her and in network.      Meds ordered this encounter  Medications  . hydrochlorothiazide (HYDRODIURIL) 25 MG tablet    Sig: Take 1 tablet (25 mg total) by mouth daily.    Dispense:  90 tablet    Refill:  1    Order Specific Question:   Supervising Provider    Answer:   Charlott Rakes [[id number]]  . amLODipine (NORVASC) 10 MG tablet    Sig: Take 1 tablet (10 mg total) by mouth daily.    Dispense:  90 tablet    Refill:  1    Order Specific Question:   Supervising Provider    Answer:   Charlott Rakes [[id number]]  . doxycycline (VIBRA-TABS) 100 MG tablet    Sig: Take 1 tablet (100 mg total) by mouth 2 (two) times daily.    Dispense:  20 tablet    Refill:  0    Order Specific Question:   Supervising Provider    Answer:   Charlott Rakes [[id number]]    Follow-up: Return in about 4 weeks (around 08/05/2018) for HTN.   Clent Demark PA

## 2018-07-29 ENCOUNTER — Ambulatory Visit (HOSPITAL_COMMUNITY)
Admission: RE | Admit: 2018-07-29 | Discharge: 2018-07-29 | Disposition: A | Discharge status: Home / Self Care | Payer: 59 | Source: Ambulatory Visit | Attending: Physician Assistant | Admitting: Physician Assistant

## 2018-07-29 DIAGNOSIS — M25562 Pain in left knee: Secondary | ICD-10-CM | POA: Insufficient documentation

## 2018-07-29 DIAGNOSIS — M238X1 Other internal derangements of right knee: Secondary | ICD-10-CM | POA: Diagnosis not present

## 2018-07-29 DIAGNOSIS — M25561 Pain in right knee: Secondary | ICD-10-CM | POA: Diagnosis present

## 2018-07-29 DIAGNOSIS — I739 Peripheral vascular disease, unspecified: Secondary | ICD-10-CM | POA: Diagnosis not present

## 2018-07-29 DIAGNOSIS — G8929 Other chronic pain: Secondary | ICD-10-CM | POA: Diagnosis not present

## 2018-07-30 ENCOUNTER — Other Ambulatory Visit (INDEPENDENT_AMBULATORY_CARE_PROVIDER_SITE_OTHER): Payer: Self-pay | Admitting: Physician Assistant

## 2018-07-30 ENCOUNTER — Telehealth (INDEPENDENT_AMBULATORY_CARE_PROVIDER_SITE_OTHER): Payer: Self-pay

## 2018-07-30 DIAGNOSIS — G8929 Other chronic pain: Secondary | ICD-10-CM

## 2018-07-30 DIAGNOSIS — M25562 Pain in left knee: Principal | ICD-10-CM

## 2018-07-30 NOTE — Telephone Encounter (Signed)
Patient is aware that XR reveals osteoarthritis of left knee and mild arthritis of right knee. Patient has been contacted by ortho; she prefers to go to [facility], please send referral there. Nat Christen, CMA

## 2018-07-30 NOTE — Telephone Encounter (Signed)
-----   Message from Clent Demark, PA-C sent at 07/30/2018  9:40 AM EDT ----- Left knee with signs of osteoarthritis. Right knee with mild sign are arthritis. I have made referral to ortho.

## 2018-07-30 NOTE — Telephone Encounter (Signed)
Yvette Davis, could you reroute referral to [facility] or do you need a new referral order?

## 2018-07-31 NOTE — Telephone Encounter (Signed)
Noted. Yvette Davis

## 2018-08-05 ENCOUNTER — Ambulatory Visit (INDEPENDENT_AMBULATORY_CARE_PROVIDER_SITE_OTHER): Payer: 59 | Admitting: Physician Assistant

## 2018-08-19 ENCOUNTER — Ambulatory Visit (INDEPENDENT_AMBULATORY_CARE_PROVIDER_SITE_OTHER): Payer: 59 | Admitting: Physician Assistant

## 2018-08-19 ENCOUNTER — Other Ambulatory Visit: Payer: Self-pay

## 2018-08-19 ENCOUNTER — Encounter (INDEPENDENT_AMBULATORY_CARE_PROVIDER_SITE_OTHER): Payer: Self-pay | Admitting: Physician Assistant

## 2018-08-19 VITALS — BP 162/93 | HR 85 | Temp 98.2°F | Ht 64.0 in | Wt 201.0 lb

## 2018-08-19 DIAGNOSIS — F17200 Nicotine dependence, unspecified, uncomplicated: Secondary | ICD-10-CM

## 2018-08-19 DIAGNOSIS — I10 Essential (primary) hypertension: Secondary | ICD-10-CM

## 2018-08-19 DIAGNOSIS — M25562 Pain in left knee: Secondary | ICD-10-CM

## 2018-08-19 DIAGNOSIS — F439 Reaction to severe stress, unspecified: Secondary | ICD-10-CM

## 2018-08-19 DIAGNOSIS — G8929 Other chronic pain: Secondary | ICD-10-CM

## 2018-08-19 MED ORDER — NICOTINE 14 MG/24HR TD PT24: 14.0000 mg | MEDICATED_PATCH | Freq: Every day | TRANSDERMAL | 0 refills | Status: DC

## 2018-08-19 MED ORDER — LOSARTAN POTASSIUM 50 MG PO TABS: 50.0000 mg | ORAL_TABLET | Freq: Every day | ORAL | 2 refills | Status: DC

## 2018-08-19 MED ORDER — CLONIDINE HCL 0.1 MG PO TABS
0.1000 mg | ORAL_TABLET | Freq: Once | ORAL | Status: AC
  Administered 2018-08-19: 0.1 mg via ORAL

## 2018-08-19 MED ORDER — NICOTINE 7 MG/24HR TD PT24: 7.0000 mg | MEDICATED_PATCH | Freq: Every day | TRANSDERMAL | 0 refills | Status: DC

## 2018-08-19 MED ORDER — NICOTINE 21 MG/24HR TD PT24: 21.0000 mg | MEDICATED_PATCH | Freq: Every day | TRANSDERMAL | 0 refills | Status: DC

## 2018-08-19 NOTE — Progress Notes (Signed)
Subjective:  Patient ID: Yvette Davis, female    DOB: [birth year]  Age: 57 y.o. MRN: [id number]  CC: f/u HTN  HPI Yvette Davis is a 57 y.o. female with a medical history of HTN and endometriosis presents for HTN f/u. Last BP 167/98 mmHg six weeks ago. Amlodipine was increased to 10 mg and HCTZ was increased to 25 mg. Taking medications as directed. BP 173/101 mmHg in clinic today. Thinks her work stress is contributing to her elevated BP. Denies CP, palpitations, SOB, HA, tingling, numbness, abdominal pain, f/c/n/v, rash, LE edema, or GI/GU sxs.     In regards to bilateral knee pain, left XR revealed Moderate medial compartment joint space loss and possible patellofemoral compartment degeneration. Right knee with mild degenerative changes. Pt has already been referred to ortho but change to [facility] was requested by patient. Change to [facility] was made but patient asked to be referred elsewhere since she has an outstanding bill with [facility]. Pt now referred to [id number].     Outpatient Medications Prior to Visit  Medication Sig Dispense Refill  . amLODipine (NORVASC) 10 MG tablet Take 1 tablet (10 mg total) by mouth daily. 90 tablet 1  . hydrochlorothiazide (HYDRODIURIL) 25 MG tablet Take 1 tablet (25 mg total) by mouth daily. 90 tablet 1  . doxycycline (VIBRA-TABS) 100 MG tablet Take 1 tablet (100 mg total) by mouth 2 (two) times daily. 20 tablet 0   No facility-administered medications prior to visit.      ROS Review of Systems  Constitutional: Negative for chills, fever and malaise/fatigue.  Eyes: Negative for blurred vision.  Respiratory: Negative for shortness of breath.   Cardiovascular: Negative for chest pain and palpitations.  Gastrointestinal: Negative for abdominal pain and nausea.  Genitourinary: Negative for dysuria and hematuria.  Musculoskeletal: Positive for joint pain. Negative for myalgias.  Skin: Negative for rash.  Neurological:  Negative for tingling and headaches.  Psychiatric/Behavioral: Negative for depression. The patient is not nervous/anxious.     Objective:  Ht 5\' 4"  (1.626 m)   Wt 201 lb (91.2 kg)   BMI 34.50 kg/m   Vitals:   08/19/18 1357  BP: (!) 173/101  Pulse: 84  Temp: 98.2 F (36.8 C)  TempSrc: Oral  SpO2: 96%  Weight: 201 lb (91.2 kg)  Height: 5\' 4"  (1.626 m)     Physical Exam  Constitutional: She is oriented to person, place, and time.  Well developed, well nourished, NAD, polite  HENT:  Head: Normocephalic and atraumatic.  Eyes: No scleral icterus.  Neck: Normal range of motion. Neck supple. No thyromegaly present.  Cardiovascular: Normal rate, regular rhythm and normal heart sounds.  Pulmonary/Chest: Effort normal and breath sounds normal.  Abdominal: Soft. Bowel sounds are normal. There is no tenderness.  Musculoskeletal: She exhibits no edema.  Neurological: She is alert and oriented to person, place, and time.  Skin: Skin is warm and dry. No rash noted. No erythema. No pallor.  Psychiatric: She has a normal mood and affect. Her behavior is normal. Thought content normal.  Vitals reviewed.    Assessment & Plan:    1. Essential hypertension - cloNIDine (CATAPRES) tablet 0.1 mg - Begin Losartan 50 mg  - Continue Amlodipine 10 mg and HCTZ 25 mg - Will consider Renal Artery [place] if no appreciable difference in BP despite addition of Losartan.   2. Chronic pain of left knee - Referral made to orthopedics. Pt advised to call [facility] to  schedule her appointment.   3. Tobacco use disorder - Begin Nicoderm CQ at 21mg /24hr patch, 14mg Margo Aye patch, 7mg Margo Aye patch. Pt instructed on how to use patches and rotate patch site on shoulder.   4. Stress - Declines pharmacotherapy or psychological counseling.    Meds ordered this encounter  Medications  . cloNIDine (CATAPRES) tablet 0.1 mg  . losartan (COZAAR) 50 MG tablet    Sig: Take 1 tablet (50 mg total) by mouth daily.     Dispense:  30 tablet    Refill:  2    Order Specific Question:   Supervising Provider    Answer:   Charlott Rakes [[id number]]    Follow-up: Return in about 3 weeks (around 09/09/2018) for BP check nurse visit only.Clent Demark PA

## 2018-08-19 NOTE — Patient Instructions (Signed)

## 2018-09-09 ENCOUNTER — Ambulatory Visit (INDEPENDENT_AMBULATORY_CARE_PROVIDER_SITE_OTHER): Payer: 59

## 2018-11-06 ENCOUNTER — Ambulatory Visit (HOSPITAL_COMMUNITY)
Admission: EM | Admit: 2018-11-06 | Discharge: 2018-11-06 | Disposition: A | Discharge status: Home / Self Care | Payer: 59 | Attending: Internal Medicine | Admitting: Internal Medicine

## 2018-11-06 ENCOUNTER — Encounter (HOSPITAL_COMMUNITY): Payer: Self-pay | Admitting: Emergency Medicine

## 2018-11-06 DIAGNOSIS — R197 Diarrhea, unspecified: Secondary | ICD-10-CM

## 2018-11-06 DIAGNOSIS — J111 Influenza due to unidentified influenza virus with other respiratory manifestations: Secondary | ICD-10-CM

## 2018-11-06 DIAGNOSIS — R69 Illness, unspecified: Secondary | ICD-10-CM | POA: Diagnosis not present

## 2018-11-06 DIAGNOSIS — H6691 Otitis media, unspecified, right ear: Secondary | ICD-10-CM

## 2018-11-06 MED ORDER — TRIAMCINOLONE ACETONIDE 55 MCG/ACT NA AERO: 2.0000 | INHALATION_SPRAY | Freq: Every day | NASAL | 0 refills | Status: DC

## 2018-11-06 MED ORDER — AZITHROMYCIN 250 MG PO TABS: 250.0000 mg | ORAL_TABLET | Freq: Every day | ORAL | 0 refills | Status: DC

## 2018-11-06 NOTE — ED Provider Notes (Signed)
[place]    CSN: [id number] Arrival date & time: 11/06/18  1013     History   Chief Complaint Chief Complaint  Patient presents with  . Flu-Like Symptoms    HPI Yvette Davis is a 58 y.o. female.   She presents today with one-week history of headache, achy body, lots of coughing mostly dry.  Not much runny/congested nose, no sore throat.  Not having any vomiting, but is also having diarrhea, 4 loose stools daily, with some crampy abdominal discomfort.  Multiple coworkers with similar symptoms.   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   HPI  Past Medical History:  Diagnosis Date  . Endometriosis   . Hypertension       Past Surgical History:  Procedure Laterality Date  . CHOLECYSTECTOMY    . COLONOSCOPY       Home Medications    Prior to Admission medications   Medication Sig Start Date End Date Taking? Authorizing Provider  amLODipine (NORVASC) 10 MG tablet Take 1 tablet (10 mg total) by mouth daily. 07/08/18  Yes Clent Demark, PA-C  hydrochlorothiazide (HYDRODIURIL) 25 MG tablet Take 1 tablet (25 mg total) by mouth daily. 07/08/18  Yes Clent Demark, PA-C  losartan (COZAAR) 50 MG tablet Take 1 tablet (50 mg total) by mouth daily. 08/19/18  Yes Clent Demark, PA-C  azithromycin (ZITHROMAX) 250 MG tablet Take 1 tablet (250 mg total) by mouth daily. Take first 2 tablets together, then 1 every day until finished. 11/06/18   Wynona Luna, MD  nicotine (NICODERM CQ - DOSED IN MG/24 HOURS) 14 mg/24hr patch Place 1 patch (14 mg total) onto the skin daily. 08/19/18   Clent Demark, PA-C  nicotine (NICODERM CQ - DOSED IN MG/24 HOURS) 21 mg/24hr patch Place 1 patch (21 mg total) onto the skin daily. 08/19/18   Clent Demark, PA-C  nicotine (NICODERM CQ - DOSED IN MG/24 HR) 7 mg/24hr patch Place 1 patch (7 mg total) onto the skin daily. 08/19/18   Clent Demark, PA-C  triamcinolone (NASACORT) 55 MCG/ACT AERO  nasal inhaler Place 2 sprays into the nose daily. 11/06/18   Wynona Luna, MD    Family History History reviewed. No pertinent family history.  Social History Social History   Tobacco Use  . Smoking status: Current Every Day Smoker    Packs/day: 0.25    Years: 31.00    Pack years: 7.75  . Smokeless tobacco: Never Used  Substance Use Topics  . Alcohol use: Yes    Comment: Drinks beer now and then  . Drug use: No     Allergies   Meperidine hcl; Morphine; Penicillins; and Sulfonamide derivatives   Review of Systems Review of Systems  All other systems reviewed and are negative.    Physical Exam Triage Vital Signs ED Triage Vitals  Enc Vitals Group     BP 11/06/18 1136 (!) 129/57     Pulse Rate 11/06/18 1136 72     Resp 11/06/18 1136 16     Temp 11/06/18 1136 97.7 F (36.5 C)     Temp Source 11/06/18 1136 Temporal     SpO2 11/06/18 1136 100 %     Weight --      Height --      Pain Score 11/06/18 1137 5     Pain Loc --    Updated Vital Signs BP (!) 129/57 (BP Location: Right Arm)   Pulse 72  Temp 97.7 F (36.5 C) (Temporal)   Resp 16   SpO2 100%  Physical Exam Vitals signs and nursing note reviewed.  Constitutional:      Comments: Alert, nicely groomed Looks ill but not toxic  HENT:     Head: Atraumatic.     Comments: Right TM is dull, moderately red Left TM is dull, no erythema Moderate nasal congestion bilaterally Mouth is a little dry, posterior pharynx is hard to visualize Eyes:     Comments: Conjugate gaze, no eye redness/drainage  Neck:     Musculoskeletal: Neck supple.  Cardiovascular:     Rate and Rhythm: Normal rate and regular rhythm.  Pulmonary:     Effort: No respiratory distress.     Breath sounds: No wheezing or rales.  Abdominal:     General: There is no distension.  Musculoskeletal: Normal range of motion.     Comments: No leg swelling  Skin:    General: Skin is warm and dry.     Comments: No cyanosis  Neurological:       Mental Status: She is alert and oriented to person, place, and time.        Final Clinical Impressions(s) / UC Diagnoses   Final diagnoses:  Influenza-like illness  Acute right otitis media  Diarrhea, unspecified type     Discharge Instructions     Symptoms today sounds most consistent with a flu like illness with secondary ear infection.  Cannot completely rule out a stomach bug though, so a stool test was ordered; please bring a sample back as soon as you can.The urgent care will contact you if a change in treatment is needed, based on test results.  Prescription for zithromax (antibiotic) and a nasal steroid spray (for severe congestion) was sent to the pharmacy.  Rest and push fluids.  Anticipate gradual improvement in head congestion, headache, over the next several days.  Stool consistency/frequency and cough may take a couple of weeks to resolve.  Recheck for new fever >100.5, increasing phlegm production/nasal discharge, increasing stool frequency/worsening stool consistency, or if not starting to improve in a few days.       ED Prescriptions    Medication Sig Dispense Auth. Provider   azithromycin (ZITHROMAX) 250 MG tablet Take 1 tablet (250 mg total) by mouth daily. Take first 2 tablets together, then 1 every day until finished. 6 tablet Wynona Luna, MD   triamcinolone (NASACORT) 55 MCG/ACT AERO nasal inhaler Place 2 sprays into the nose daily. 1 Inhaler Wynona Luna, MD        Wynona Luna, MD 11/11/18 [phone]

## 2018-11-06 NOTE — ED Triage Notes (Signed)
Pt presents to [facility] for assessment of cough, congestion, abdominal pain, diarrhea, nausea, low appetite x 1 week.  Denies fevers

## 2018-11-06 NOTE — Discharge Instructions (Addendum)
Symptoms today sounds most consistent with a flu like illness with secondary ear infection.  Cannot completely rule out a stomach bug though, so a stool test was ordered; please bring a sample back as soon as you can.The urgent care will contact you if a change in treatment is needed, based on test results.  Prescription for zithromax (antibiotic) and a nasal steroid spray (for severe congestion) was sent to the pharmacy.  Rest and push fluids.  Anticipate gradual improvement in head congestion, headache, over the next several days.  Stool consistency/frequency and cough may take a couple of weeks to resolve.  Recheck for new fever >100.5, increasing phlegm production/nasal discharge, increasing stool frequency/worsening stool consistency, or if not starting to improve in a few days.

## 2019-04-03 ENCOUNTER — Ambulatory Visit (INDEPENDENT_AMBULATORY_CARE_PROVIDER_SITE_OTHER): Payer: Worker's Compensation

## 2019-04-03 ENCOUNTER — Other Ambulatory Visit: Payer: Self-pay

## 2019-04-03 ENCOUNTER — Encounter (HOSPITAL_COMMUNITY): Payer: Self-pay

## 2019-04-03 ENCOUNTER — Ambulatory Visit (HOSPITAL_COMMUNITY)
Admission: EM | Admit: 2019-04-03 | Discharge: 2019-04-03 | Disposition: A | Discharge status: Home / Self Care | Payer: Worker's Compensation | Attending: Family | Admitting: Family

## 2019-04-03 DIAGNOSIS — S8002XA Contusion of left knee, initial encounter: Secondary | ICD-10-CM | POA: Diagnosis not present

## 2019-04-03 DIAGNOSIS — I10 Essential (primary) hypertension: Secondary | ICD-10-CM

## 2019-04-03 DIAGNOSIS — M25562 Pain in left knee: Secondary | ICD-10-CM

## 2019-04-03 DIAGNOSIS — S5002XA Contusion of left elbow, initial encounter: Secondary | ICD-10-CM

## 2019-04-03 MED ORDER — HYDROCHLOROTHIAZIDE 25 MG PO TABS: 25.0000 mg | ORAL_TABLET | Freq: Every day | ORAL | 1 refills | Status: DC

## 2019-04-03 MED ORDER — LOSARTAN POTASSIUM 50 MG PO TABS: 50.0000 mg | ORAL_TABLET | Freq: Every day | ORAL | 1 refills | Status: DC

## 2019-04-03 MED ORDER — AMLODIPINE BESYLATE 10 MG PO TABS: 10.0000 mg | ORAL_TABLET | Freq: Every day | ORAL | 1 refills | Status: DC

## 2019-04-03 MED ORDER — DICLOFENAC SODIUM 1 % TD GEL: 4.0000 g | Freq: Four times a day (QID) | TRANSDERMAL | 0 refills | Status: DC

## 2019-04-03 NOTE — Discharge Instructions (Addendum)
Recommend use Voltaren gel- apply to affected areas 4 times a day as directed. May apply ice for the next 24 to 48 hours to help with swelling and comfort. May wear knee sleeve for support. Restart blood pressure medication today. Follow-up with your Orthopedic if knee pain does not improve within 4 to 5 days.

## 2019-04-03 NOTE — ED Provider Notes (Signed)
[facility]    CSN: [id number] Arrival date & time: 04/03/19  1037     History   Chief Complaint Chief Complaint  Patient presents with  . Knee Injury    Left    HPI Yvette Davis is a 58 y.o. female.   58 year old female presents with left knee pain and bruising after she fell at work yesterday. 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 She jumped back and hit the resident's bedside table and fell to the ground, hitting her left knee and left elbow. She applied ice to the area at work but was still able to walk and finished her shift. This morning when she woke up, she experienced significantly more knee pain and took some Ibuprofen with some relief. She is concerned over the bruising and possible injury to her knee. She has a history of bilateral knee pain but no previous procedures or surgeries on her knees. She can walk and bend her knee but with pain. She can fully move her elbow and is not concerned over elbow injury. She has a history of HTN but has run out of most of her blood pressure medications due to not being able to see her PCP during COVID-19 stay at home orders. She requests refills on Norvasc, HCTZ and Cozaar until she can see her PCP. She still smokes daily and has not used any Nicoderm patches recently.   The history is provided by the patient.    Past Medical History:  Diagnosis Date  . Endometriosis   . Hypertension     There are no active problems to display for this patient.   Past Surgical History:  Procedure Laterality Date  . ABDOMINAL HYSTERECTOMY    . CHOLECYSTECTOMY    . CHOLECYSTECTOMY    . COLONOSCOPY      OB History   No obstetric history on file.      Home Medications    Prior to Admission medications   Medication Sig Start Date End Date Taking? Authorizing Provider  amLODipine (NORVASC) 10 MG tablet Take 1  tablet (10 mg total) by mouth daily. 04/03/19   Katy Apo, NP  diclofenac sodium (VOLTAREN) 1 % GEL Apply 4 g topically 4 (four) times daily. To affected area as needed. 04/03/19   Katy Apo, NP  hydrochlorothiazide (HYDRODIURIL) 25 MG tablet Take 1 tablet (25 mg total) by mouth daily. 04/03/19   Katy Apo, NP  losartan (COZAAR) 50 MG tablet Take 1 tablet (50 mg total) by mouth daily. 04/03/19   Katy Apo, NP  nicotine (NICODERM CQ - DOSED IN MG/24 HOURS) 14 mg/24hr patch Place 1 patch (14 mg total) onto the skin daily. 08/19/18   Clent Demark, PA-C  nicotine (NICODERM CQ - DOSED IN MG/24 HOURS) 21 mg/24hr patch Place 1 patch (21 mg total) onto the skin daily. 08/19/18   Clent Demark, PA-C  nicotine (NICODERM CQ - DOSED IN MG/24 HR) 7 mg/24hr patch Place 1 patch (7 mg total) onto the skin daily. 08/19/18   Clent Demark, PA-C  triamcinolone (NASACORT) 55 MCG/ACT AERO nasal inhaler Place 2 sprays into the nose daily. 11/06/18   Wynona Luna, MD    Family History Family History  Family history unknown: Yes    Social History Social History   Tobacco Use  .  Smoking status: Current Every Day Smoker    Packs/day: 0.25    Years: 31.00    Pack years: 7.75  . Smokeless tobacco: Never Used  Substance Use Topics  . Alcohol use: Yes    Comment: Drinks beer now and then  . Drug use: No     Allergies   Meperidine hcl, Morphine, Penicillins, and Sulfonamide derivatives   Review of Systems Review of Systems  Constitutional: Negative for activity change, appetite change, chills, diaphoresis, fatigue and fever.  HENT: Negative for nosebleeds and sore throat.   Eyes: Negative for photophobia and visual disturbance.  Respiratory: Negative for cough, chest tightness, shortness of breath and wheezing.   Cardiovascular: Negative for chest pain and palpitations.  Gastrointestinal: Negative for abdominal pain, nausea and vomiting.  Musculoskeletal:  Positive for arthralgias, joint swelling and myalgias. Negative for neck pain and neck stiffness.  Skin: Positive for color change. Negative for rash and wound.  Allergic/Immunologic: Positive for environmental allergies. Negative for immunocompromised state.  Neurological: Negative for dizziness, tremors, seizures, syncope, speech difficulty, weakness, light-headedness, numbness and headaches.  Hematological: Negative for adenopathy. Does not bruise/bleed easily.     Physical Exam Triage Vital Signs ED Triage Vitals  Enc Vitals Group     BP 04/03/19 1058 (!) 205/111     Pulse Rate 04/03/19 1058 87     Resp 04/03/19 1058 17     Temp --      Temp Source 04/03/19 1058 Oral     SpO2 04/03/19 1058 95 %     Weight --      Height --      Head Circumference --      Peak Flow --      Pain Score 04/03/19 1059 7     Pain Loc --      Pain Edu? --      Excl. in [place]? --    No data found.  Updated Vital Signs BP (!) 205/111 (BP Location: Left Arm)   Pulse 87   Resp 17   SpO2 95%   Visual Acuity Right Eye Distance:   Left Eye Distance:   Bilateral Distance:    Right Eye Near:   Left Eye Near:    Bilateral Near:     Physical Exam Vitals signs and nursing note reviewed.  Constitutional:      General: She is awake. She is not in acute distress.    Appearance: She is well-developed, well-groomed and overweight. She is not ill-appearing.     Comments: Patient is sitting in exam chair in no acute distress but does have difficulty changing positions due to pain.   HENT:     Head: Normocephalic and atraumatic.  Eyes:     Extraocular Movements: Extraocular movements intact.     Conjunctiva/sclera: Conjunctivae normal.  Neck:     Musculoskeletal: Normal range of motion.  Cardiovascular:     Rate and Rhythm: Normal rate and regular rhythm.     Pulses: Normal pulses.     Heart sounds: Normal heart sounds. No murmur.  Pulmonary:     Effort: Pulmonary effort is normal. No respiratory  distress.     Breath sounds: Normal breath sounds. No stridor. No decreased breath sounds, wheezing, rhonchi or rales.  Musculoskeletal:        General: Swelling, tenderness and signs of injury present.     Left elbow: She exhibits swelling. She exhibits normal range of motion, no effusion, no deformity and no laceration. Tenderness found.  Left knee: She exhibits decreased range of motion, swelling and ecchymosis. She exhibits no effusion, no laceration and no erythema. Tenderness found. Patellar tendon tenderness noted.       Arms:       Legs:     Comments: Has decreased range of motion of left knee- especially with flexion and full extension. Bruising present just below patella and along lateral aspect of knee. Very tender. No distinct effusion seen. No open wound. No redness. Good distal pulses and capillary refill. No neuro deficits noted.   Left elbow has full range of motion. Bruising present along Olecranon and lateral epicondyle. Tender. No other redness seen. Slight swelling. No neuro deficits noted.   Skin:    General: Skin is warm and dry.     Capillary Refill: Capillary refill takes less than 2 seconds.     Findings: Bruising present. No erythema or rash.  Neurological:     General: No focal deficit present.     Mental Status: She is alert and oriented to person, place, and time.     Sensory: Sensation is intact.     Motor: Motor function is intact.  Psychiatric:        Mood and Affect: Mood normal.        Behavior: Behavior normal. Behavior is cooperative.        Thought Content: Thought content normal.        Judgment: Judgment normal.      UC Treatments / Results  Labs (all labs ordered are listed, but only abnormal results are displayed) Labs Reviewed - No data to display  EKG None  Radiology Dg Knee Complete 4 Views Left  Result Date: 04/03/2019 CLINICAL DATA:  Left knee pain due to fall EXAM: LEFT KNEE - COMPLETE 4+ VIEW COMPARISON:  07/29/2018  FINDINGS: Probable small joint effusion. No acute fracture or malalignment. Generalized degenerative marginal spurring. Atherosclerotic calcification. IMPRESSION: 1. Negative for fracture. 2. Degenerative spurring and small joint effusion. Electronically Signed   By: Monte Fantasia M.D.   On: 04/03/2019 12:49    Procedures Procedures (including critical care time)  Medications Ordered in UC Medications - No data to display  Initial Impression / Assessment and Plan / UC Course  I have reviewed the triage vital signs and the nursing notes.  Pertinent labs & imaging results that were available during my care of the patient were reviewed by me and considered in my medical decision making (see chart for details).    Reviewed x-ray results with patient- no distinct fracture. Does show degenerative changes and small effusion. Recommend use Voltaren gel- apply 4 times a day to knee as needed. May wear knee sleeve for support. May apply ice for the next 24 to 48 hours for comfort. Patient has an Orthopedic and would recommend she contact them for an appointment for follow-up next week. Note written for work with restrictions for 1 week due to knee injury.  Reviewed elevated blood pressure readings and need to restart medications ASAP. Refilled Norvasc, HCTZ and Losartan for 30 days with 1 refill- need to contact her PCP for further management. Recommend follow-up with her PCP for HTN and with her Orthopedic if knee pain does not improve within 4 to 5 days.   Final Clinical Impressions(s) / UC Diagnoses   Final diagnoses:  Contusion of left knee, initial encounter  Elevated blood pressure reading with diagnosis of hypertension  Acute pain of left knee  Contusion of left elbow, initial encounter  Discharge Instructions     Recommend use Voltaren gel- apply to affected areas 4 times a day as directed. May apply ice for the next 24 to 48 hours to help with swelling and comfort. May wear knee  sleeve for support. Restart blood pressure medication today. Follow-up with your Orthopedic if knee pain does not improve within 4 to 5 days.     ED Prescriptions    Medication Sig Dispense Auth. Provider   amLODipine (NORVASC) 10 MG tablet Take 1 tablet (10 mg total) by mouth daily. 30 tablet Katy Apo, NP   hydrochlorothiazide (HYDRODIURIL) 25 MG tablet Take 1 tablet (25 mg total) by mouth daily. 30 tablet Katy Apo, NP   losartan (COZAAR) 50 MG tablet Take 1 tablet (50 mg total) by mouth daily. 30 tablet Katy Apo, NP   diclofenac sodium (VOLTAREN) 1 % GEL Apply 4 g topically 4 (four) times daily. To affected area as needed. 150 g Katy Apo, NP     Controlled Substance Prescriptions [place] Controlled Substance Registry consulted? Not Applicable   Katy Apo, NP 04/04/19 1400

## 2019-04-03 NOTE — ED Triage Notes (Signed)
Pt presents with left knee pain and bruising after falling at work yesterday.

## 2019-05-19 ENCOUNTER — Other Ambulatory Visit: Payer: Self-pay

## 2019-05-19 ENCOUNTER — Ambulatory Visit (INDEPENDENT_AMBULATORY_CARE_PROVIDER_SITE_OTHER): Payer: 59 | Admitting: Primary Care

## 2019-05-19 ENCOUNTER — Encounter (INDEPENDENT_AMBULATORY_CARE_PROVIDER_SITE_OTHER): Payer: Self-pay | Admitting: Primary Care

## 2019-05-19 VITALS — BP 136/82 | HR 71 | Temp 97.6°F | Ht 64.0 in | Wt 194.4 lb

## 2019-05-19 DIAGNOSIS — F17200 Nicotine dependence, unspecified, uncomplicated: Secondary | ICD-10-CM

## 2019-05-19 DIAGNOSIS — I10 Essential (primary) hypertension: Secondary | ICD-10-CM

## 2019-05-19 DIAGNOSIS — E661 Drug-induced obesity: Secondary | ICD-10-CM

## 2019-05-19 DIAGNOSIS — K089 Disorder of teeth and supporting structures, unspecified: Secondary | ICD-10-CM

## 2019-05-19 DIAGNOSIS — Z6833 Body mass index (BMI) 33.0-33.9, adult: Secondary | ICD-10-CM

## 2019-05-19 MED ORDER — HYDROCHLOROTHIAZIDE 25 MG PO TABS: 25.0000 mg | ORAL_TABLET | Freq: Every day | ORAL | 3 refills | Status: DC

## 2019-05-19 MED ORDER — AMLODIPINE BESYLATE 10 MG PO TABS: 10.0000 mg | ORAL_TABLET | Freq: Every day | ORAL | 3 refills | Status: DC

## 2019-05-19 MED ORDER — LOSARTAN POTASSIUM 50 MG PO TABS: 50.0000 mg | ORAL_TABLET | Freq: Every day | ORAL | 3 refills | Status: DC

## 2019-05-19 NOTE — Patient Instructions (Signed)

## 2019-05-19 NOTE — Progress Notes (Signed)
Established Patient Office Visit  Subjective:  Patient ID: Yvette Davis, female    DOB: October 18, 1960  Age: 58 y.o. MRN: [id number]  CC:  Chief Complaint  Patient presents with  . Establish Care    HTN    HPI Yvette Davis presents for the management for hypertension. She is  denies shortness of breath, headaches, chest pain or lower extremity edema  Past Medical History:  Diagnosis Date  . Endometriosis   . Hypertension     Past Surgical History:  Procedure Laterality Date  . ABDOMINAL HYSTERECTOMY    . CHOLECYSTECTOMY    . CHOLECYSTECTOMY    . COLONOSCOPY      Family History  Family history unknown: Yes    Social History   Socioeconomic History  . Marital status: Single    Spouse name: Not on file  . Number of children: Not on file  . Years of education: Not on file  . Highest education level: Not on file  Occupational History  . Not on file  Social Needs  . Financial resource strain: Not on file  . Food insecurity    Worry: Not on file    Inability: Not on file  . Transportation needs    Medical: Not on file    Non-medical: Not on file  Tobacco Use  . Smoking status: Current Every Day Smoker    Packs/day: 0.25    Years: 31.00    Pack years: 7.75  . Smokeless tobacco: Never Used  Substance and Sexual Activity  . Alcohol use: Yes    Comment: Drinks beer now and then  . Drug use: No  . Sexual activity: Never  Lifestyle  . Physical activity    Days per week: Not on file    Minutes per session: Not on file  . Stress: Not on file  Relationships  . Social Herbalist on phone: Not on file    Gets together: Not on file    Attends religious service: Not on file    Active member of club or organization: Not on file    Attends meetings of clubs or organizations: Not on file    Relationship status: Not on file  . Intimate partner violence    Fear of current or ex partner: Not on file    Emotionally abused: Not on file   Physically abused: Not on file    Forced sexual activity: Not on file  Other Topics Concern  . Not on file  Social History Narrative  . Not on file    Outpatient Medications Prior to Visit  Medication Sig Dispense Refill  . amLODipine (NORVASC) 10 MG tablet Take 1 tablet (10 mg total) by mouth daily. 30 tablet 1  . hydrochlorothiazide (HYDRODIURIL) 25 MG tablet Take 1 tablet (25 mg total) by mouth daily. 30 tablet 1  . losartan (COZAAR) 50 MG tablet Take 1 tablet (50 mg total) by mouth daily. 30 tablet 1  . diclofenac sodium (VOLTAREN) 1 % GEL Apply 4 g topically 4 (four) times daily. To affected area as needed. (Patient not taking: Reported on 05/19/2019) 150 g 0  . nicotine (NICODERM CQ - DOSED IN MG/24 HOURS) 14 mg/24hr patch Place 1 patch (14 mg total) onto the skin daily. 28 patch 0  . nicotine (NICODERM CQ - DOSED IN MG/24 HOURS) 21 mg/24hr patch Place 1 patch (21 mg total) onto the skin daily. 28 patch 0  . nicotine (NICODERM CQ - DOSED  IN MG/24 HR) 7 mg/24hr patch Place 1 patch (7 mg total) onto the skin daily. 28 patch 0  . triamcinolone (NASACORT) 55 MCG/ACT AERO nasal inhaler Place 2 sprays into the nose daily. 1 Inhaler 0   No facility-administered medications prior to visit.     Allergies  Allergen Reactions  . Meperidine Hcl     REACTION: delirious  . Morphine     REACTION: delirious  . Penicillins     REACTION: throat closes  . Sulfonamide Derivatives     REACTION: itching    ROS Review of Systems  All other systems reviewed and are negative.     Objective:    Physical Exam  Constitutional: She is oriented to person, place, and time. She appears well-developed and well-nourished.  HENT:  Head: Normocephalic.  Eyes: Pupils are equal, round, and reactive to light. EOM are normal.  Neck: Normal range of motion. Neck supple.  Cardiovascular: Normal rate and regular rhythm.  Pulmonary/Chest: Effort normal and breath sounds normal.  Abdominal: Soft. Bowel  sounds are normal. She exhibits distension.  Musculoskeletal: Normal range of motion.  Neurological: She is oriented to person, place, and time.  Skin: Skin is warm and dry.  Psychiatric: She has a normal mood and affect.    BP 136/82 (BP Location: Left Arm, Patient Position: Sitting, Cuff Size: Normal)   Pulse 71   Temp 97.6 F (36.4 C) (Tympanic)   Ht 5\' 4"  (1.626 m)   Wt 194 lb 6.4 oz (88.2 kg)   SpO2 98%   BMI 33.37 kg/m  Wt Readings from Last 3 Encounters:  05/19/19 194 lb 6.4 oz (88.2 kg)  08/19/18 201 lb (91.2 kg)  07/08/18 203 lb 6.4 oz (92.3 kg)     Health Maintenance Due  Topic Date Due  . PAP SMEAR-Modifier  02/09/1981  . MAMMOGRAM  08/24/2010  . INFLUENZA VACCINE  05/08/2019    There are no preventive care reminders to display for this patient.  No results found for: TSH Lab Results  Component Value Date   WBC 8.1 08/27/2016   HGB 16.0 (H) 06/09/2018   HCT 47.0 (H) 06/09/2018   MCV 78.1 08/27/2016   PLT 224 08/27/2016   Lab Results  Component Value Date   NA 140 06/09/2018   K 4.0 06/09/2018   CO2 25 08/27/2016   GLUCOSE 96 06/09/2018   BUN 4 (L) 06/09/2018   CREATININE 0.50 06/09/2018   CALCIUM 9.0 08/27/2016   ANIONGAP 9 08/27/2016     Assessment & Plan:    Meds ordered this encounter  Medications  . amLODipine (NORVASC) 10 MG tablet    Sig: Take 1 tablet (10 mg total) by mouth daily.    Dispense:  30 tablet    Refill:  3  . hydrochlorothiazide (HYDRODIURIL) 25 MG tablet    Sig: Take 1 tablet (25 mg total) by mouth daily.    Dispense:  30 tablet    Refill:  3  . losartan (COZAAR) 50 MG tablet    Sig: Take 1 tablet (50 mg total) by mouth daily.    Dispense:  30 tablet    Refill:  3   Avea was seen today for establish care.  Diagnoses and all orders for this visit:  Essential hypertension Counseled on blood pressure goal of less than 130/80, low-sodium, DASH diet, medication compliance, 150 minutes of moderate intensity  exercise per week. Discussed medication compliance, adverse effects. -     amLODipine (NORVASC) 10 MG  tablet; Take 1 tablet (10 mg total) by mouth daily. -     hydrochlorothiazide (HYDRODIURIL) 25 MG tablet; Take 1 tablet (25 mg total) by mouth daily.  Tobacco use disorder Nicotine affect every organ in the body second leading cause of death.  Increased risk for lung cancer and other respiratory diseases recommend cessation.  This will be reminded at each clinical visit.  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 This may lead to other co-morbidities. Lifestyle modifications of diet and exercise may reduce obesity.   Other orders -     losartan (COZAAR) 50 MG tablet; Take 1 tablet (50 mg total) by mouth daily.   Follow-up: Return in about 3 months (around 08/19/2019) for HTN/labs (fasting).    Kerin Perna, NP

## 2019-08-19 ENCOUNTER — Other Ambulatory Visit (INDEPENDENT_AMBULATORY_CARE_PROVIDER_SITE_OTHER): Payer: Self-pay | Admitting: Primary Care

## 2019-08-19 ENCOUNTER — Ambulatory Visit (INDEPENDENT_AMBULATORY_CARE_PROVIDER_SITE_OTHER): Payer: 59 | Admitting: Primary Care

## 2019-08-19 DIAGNOSIS — I10 Essential (primary) hypertension: Secondary | ICD-10-CM

## 2019-08-19 MED ORDER — AMLODIPINE BESYLATE 10 MG PO TABS: 10.0000 mg | ORAL_TABLET | Freq: Every day | ORAL | 3 refills | Status: DC

## 2019-08-19 MED ORDER — LOSARTAN POTASSIUM 50 MG PO TABS: 50.0000 mg | ORAL_TABLET | Freq: Every day | ORAL | 3 refills | Status: DC

## 2019-08-19 MED ORDER — HYDROCHLOROTHIAZIDE 25 MG PO TABS: 25.0000 mg | ORAL_TABLET | Freq: Every day | ORAL | 3 refills | Status: DC

## 2019-09-17 ENCOUNTER — Other Ambulatory Visit: Payer: Self-pay

## 2019-09-17 ENCOUNTER — Ambulatory Visit (INDEPENDENT_AMBULATORY_CARE_PROVIDER_SITE_OTHER): Payer: 59 | Admitting: Primary Care

## 2019-09-17 ENCOUNTER — Encounter (INDEPENDENT_AMBULATORY_CARE_PROVIDER_SITE_OTHER): Payer: Self-pay | Admitting: Primary Care

## 2019-09-17 VITALS — BP 122/82 | HR 94 | Temp 97.1°F | Ht 64.0 in | Wt 188.4 lb

## 2019-09-17 DIAGNOSIS — I10 Essential (primary) hypertension: Secondary | ICD-10-CM

## 2019-09-17 DIAGNOSIS — Z90710 Acquired absence of both cervix and uterus: Secondary | ICD-10-CM

## 2019-09-17 DIAGNOSIS — E661 Drug-induced obesity: Secondary | ICD-10-CM | POA: Diagnosis not present

## 2019-09-17 DIAGNOSIS — Z6833 Body mass index (BMI) 33.0-33.9, adult: Secondary | ICD-10-CM | POA: Diagnosis not present

## 2019-09-17 DIAGNOSIS — F17200 Nicotine dependence, unspecified, uncomplicated: Secondary | ICD-10-CM

## 2019-09-17 DIAGNOSIS — Z114 Encounter for screening for human immunodeficiency virus [HIV]: Secondary | ICD-10-CM

## 2019-09-17 DIAGNOSIS — E8940 Asymptomatic postprocedural ovarian failure: Secondary | ICD-10-CM

## 2019-09-17 MED ORDER — HYDROCHLOROTHIAZIDE 25 MG PO TABS: 25.0000 mg | ORAL_TABLET | Freq: Every day | ORAL | 5 refills | Status: DC

## 2019-09-17 MED ORDER — AMLODIPINE BESYLATE 10 MG PO TABS: 10.0000 mg | ORAL_TABLET | Freq: Every day | ORAL | 5 refills | Status: DC

## 2019-09-17 NOTE — Progress Notes (Signed)
Established Patient Office Visit  Subjective:  Patient ID: Yvette Davis, female    DOB: 05-Apr-1961  Age: 58 y.o. MRN: [id number]  CC:  Chief Complaint  Patient presents with  . Follow-up    hypertension    HPI DANEYA FATEMI presents for hypertension management she denies shortness of breath, headaches, chest pain or lower extremity edema. Past Medical History:  Diagnosis Date  . Endometriosis   . Hypertension     Past Surgical History:  Procedure Laterality Date  . ABDOMINAL HYSTERECTOMY    . CHOLECYSTECTOMY    . CHOLECYSTECTOMY    . COLONOSCOPY      Family History  Family history unknown: Yes    Social History   Socioeconomic History  . Marital status: Single    Spouse name: Not on file  . Number of children: Not on file  . Years of education: Not on file  . Highest education level: Not on file  Occupational History  . Not on file  Tobacco Use  . Smoking status: Current Every Day Smoker    Packs/day: 0.25    Years: 31.00    Pack years: 7.75  . Smokeless tobacco: Never Used  Substance and Sexual Activity  . Alcohol use: Yes    Comment: Drinks beer now and then  . Drug use: No  . Sexual activity: Never  Other Topics Concern  . Not on file  Social History Narrative  . Not on file   Social Determinants of Health   Financial Resource Strain:   . Difficulty of Paying Living Expenses: Not on file  Food Insecurity:   . Worried About Charity fundraiser in the Last Year: Not on file  . Ran Out of Food in the Last Year: Not on file  Transportation Needs:   . Lack of Transportation (Medical): Not on file  . Lack of Transportation (Non-Medical): Not on file  Physical Activity:   . Days of Exercise per Week: Not on file  . Minutes of Exercise per Session: Not on file  Stress:   . Feeling of Stress : Not on file  Social Connections:   . Frequency of Communication with Friends and Family: Not on file  . Frequency of Social Gatherings with  Friends and Family: Not on file  . Attends Religious Services: Not on file  . Active Member of Clubs or Organizations: Not on file  . Attends Archivist Meetings: Not on file  . Marital Status: Not on file  Intimate Partner Violence:   . Fear of Current or Ex-Partner: Not on file  . Emotionally Abused: Not on file  . Physically Abused: Not on file  . Sexually Abused: Not on file    Outpatient Medications Prior to Visit  Medication Sig Dispense Refill  . diclofenac sodium (VOLTAREN) 1 % GEL Apply 4 g topically 4 (four) times daily. To affected area as needed. (Patient not taking: Reported on 05/19/2019) 150 g 0  . losartan (COZAAR) 50 MG tablet Take 1 tablet (50 mg total) by mouth daily. 30 tablet 3  . amLODipine (NORVASC) 10 MG tablet Take 1 tablet (10 mg total) by mouth daily. 30 tablet 3  . hydrochlorothiazide (HYDRODIURIL) 25 MG tablet Take 1 tablet (25 mg total) by mouth daily. 30 tablet 3   No facility-administered medications prior to visit.    Allergies  Allergen Reactions  . Meperidine Hcl     REACTION: delirious  . Morphine     REACTION:  delirious  . Penicillins     REACTION: throat closes  . Sulfonamide Derivatives     REACTION: itching    ROS Review of Systems  All other systems reviewed and are negative.     Objective:    Physical Exam  Constitutional: She is oriented to person, place, and time. She appears well-developed and well-nourished.  HENT:  Head: Normocephalic.  Eyes: Pupils are equal, round, and reactive to light. EOM are normal.  Cardiovascular: Normal rate and regular rhythm.  Pulmonary/Chest: Effort normal and breath sounds normal.  Abdominal: Soft. Bowel sounds are normal. She exhibits distension.  Musculoskeletal:        General: Normal range of motion.     Cervical back: Normal range of motion and neck supple.  Neurological: She is oriented to person, place, and time.  Skin: Skin is warm and dry.  Psychiatric: She has a  normal mood and affect. Her behavior is normal. Judgment and thought content normal.    BP 122/82 (BP Location: Left Arm, Patient Position: Sitting, Cuff Size: Normal)   Pulse 94   Temp (!) 97.1 F (36.2 C) (Temporal)   Ht 5\' 4"  (1.626 m)   Wt 188 lb 6.4 oz (85.5 kg)   SpO2 99%   BMI 32.34 kg/m  Wt Readings from Last 3 Encounters:  09/17/19 188 lb 6.4 oz (85.5 kg)  05/19/19 194 lb 6.4 oz (88.2 kg)  08/19/18 201 lb (91.2 kg)     Health Maintenance Due  Topic Date Due  . HIV Screening  02/10/1975  . PAP SMEAR-Modifier  02/09/1981  . MAMMOGRAM  08/24/2010    There are no preventive care reminders to display for this patient.  No results found for: TSH Lab Results  Component Value Date   WBC 8.1 08/27/2016   HGB 16.0 (H) 06/09/2018   HCT 47.0 (H) 06/09/2018   MCV 78.1 08/27/2016   PLT 224 08/27/2016   Lab Results  Component Value Date   NA 140 06/09/2018   K 4.0 06/09/2018   CO2 25 08/27/2016   GLUCOSE 96 06/09/2018   BUN 4 (L) 06/09/2018   CREATININE 0.50 06/09/2018   CALCIUM 9.0 08/27/2016   ANIONGAP 9 08/27/2016   No results found for: CHOL No results found for: HDL No results found for: LDLCALC No results found for: TRIG No results found for: CHOLHDL No results found for: HGBA1C    Assessment & Plan:  Ma[place] was seen today for follow-up.  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 This may lead to other co-morbidities CVD, diabetes and hypoxia .Lifestyle modifications of diet and exercise may reduce obesity.  -     Lipid Panel  Essential hypertension -     amLODipine (NORVASC) 10 MG tablet; Take 1 tablet (10 mg total) by mouth daily. -     hydrochlorothiazide (HYDRODIURIL) 25 MG tablet; Take 1 tablet (25 mg total) by mouth daily. -     CBC  with Differential -     Complete Metabolic Panel with GFR  Tobacco use disorder She was made aware of increased risk for lung cancer and other respiratory diseases recommend cessation.  This will be reminded at each clinical visit.  Encounter for screening for HIV -     HIV antibody (with reflex)  Post-hysterectomy menopause -  Vitamin D, 25-hydroxy   Meds ordered this encounter  Medications  . amLODipine (NORVASC) 10 MG tablet    Sig: Take 1 tablet (10 mg total) by mouth daily.    Dispense:  30 tablet    Refill:  5  . hydrochlorothiazide (HYDRODIURIL) 25 MG tablet    Sig: Take 1 tablet (25 mg total) by mouth daily.    Dispense:  30 tablet    Refill:  5    Follow-up: Return in about 6 months (around 03/17/2020) for Pap .    Kerin Perna, NP

## 2019-09-17 NOTE — Patient Instructions (Signed)
° °Calorie Counting for Weight Loss °Calories are units of energy. Your body needs a certain amount of calories from food to keep you going throughout the day. When you eat more calories than your body needs, your body stores the extra calories as fat. When you eat fewer calories than your body needs, your body burns fat to get the energy it needs. °Calorie counting means keeping track of how many calories you eat and drink each day. Calorie counting can be helpful if you need to lose weight. If you make sure to eat fewer calories than your body needs, you should lose weight. Ask your health care provider what a healthy weight is for you. °For calorie counting to work, you will need to eat the right number of calories in a day in order to lose a healthy amount of weight per week. A dietitian can help you determine how many calories you need in a day and will give you suggestions on how to reach your calorie goal. °· A healthy amount of weight to lose per week is usually 1-2 lb (0.5-0.9 kg). This usually means that your daily calorie intake should be reduced by 500-750 calories. °· Eating 1,200 - 1,500 calories per day can help most women lose weight. °· Eating 1,500 - 1,800 calories per day can help most men lose weight. °What is my plan? °My goal is to have __________ calories per day. °If I have this many calories per day, I should lose around __________ pounds per week. °What do I need to know about calorie counting? °In order to meet your daily calorie goal, you will need to: °· Find out how many calories are in each food you would like to eat. Try to do this before you eat. °· Decide how much of the food you plan to eat. °· Write down what you ate and how many calories it had. Doing this is called keeping a food log. °To successfully lose weight, it is important to balance calorie counting with a healthy lifestyle that includes regular activity. Aim for 150 minutes of moderate exercise (such as walking) or 75  minutes of vigorous exercise (such as running) each week. °Where do I find calorie information? ° °The number of calories in a food can be found on a Nutrition Facts label. If a food does not have a Nutrition Facts label, try to look up the calories online or ask your dietitian for help. °Remember that calories are listed per serving. If you choose to have more than one serving of a food, you will have to multiply the calories per serving by the amount of servings you plan to eat. For example, the label on a package of bread might say that a serving size is 1 slice and that there are 90 calories in a serving. If you eat 1 slice, you will have eaten 90 calories. If you eat 2 slices, you will have eaten 180 calories. °How do I keep a food log? °Immediately after each meal, record the following information in your food log: °· What you ate. Don't forget to include toppings, sauces, and other extras on the food. °· How much you ate. This can be measured in cups, ounces, or number of items. °· How many calories each food and drink had. °· The total number of calories in the meal. °Keep your food log near you, such as in a small notebook in your pocket, or use a mobile app or website. Some programs will   calculate calories for you and show you how many calories you have left for the day to meet your goal. °What are some calorie counting tips? ° °· Use your calories on foods and drinks that will fill you up and not leave you hungry: °? Some examples of foods that fill you up are nuts and nut butters, vegetables, lean proteins, and high-fiber foods like whole grains. High-fiber foods are foods with more than 5 g fiber per serving. °? Drinks such as sodas, specialty coffee drinks, alcohol, and juices have a lot of calories, yet do not fill you up. °· Eat nutritious foods and avoid empty calories. Empty calories are calories you get from foods or beverages that do not have many vitamins or protein, such as candy, sweets, and  soda. It is better to have a nutritious high-calorie food (such as an avocado) than a food with few nutrients (such as a bag of chips). °· Know how many calories are in the foods you eat most often. This will help you calculate calorie counts faster. °· Pay attention to calories in drinks. Low-calorie drinks include water and unsweetened drinks. °· Pay attention to nutrition labels for "low fat" or "fat free" foods. These foods sometimes have the same amount of calories or more calories than the full fat versions. They also often have added sugar, starch, or salt, to make up for flavor that was removed with the fat. °· Find a way of tracking calories that works for you. Get creative. Try different apps or programs if writing down calories does not work for you. °What are some portion control tips? °· Know how many calories are in a serving. This will help you know how many servings of a certain food you can have. °· Use a measuring cup to measure serving sizes. You could also try weighing out portions on a kitchen scale. With time, you will be able to estimate serving sizes for some foods. °· Take some time to put servings of different foods on your favorite plates, bowls, and cups so you know what a serving looks like. °· Try not to eat straight from a bag or box. Doing this can lead to overeating. Put the amount you would like to eat in a cup or on a plate to make sure you are eating the right portion. °· Use smaller plates, glasses, and bowls to prevent overeating. °· Try not to multitask (for example, watch TV or use your computer) while eating. If it is time to eat, sit down at a table and enjoy your food. This will help you to know when you are full. It will also help you to be aware of what you are eating and how much you are eating. °What are tips for following this plan? °Reading food labels °· Check the calorie count compared to the serving size. The serving size may be smaller than what you are used to  eating. °· Check the source of the calories. Make sure the food you are eating is high in vitamins and protein and low in saturated and trans fats. °Shopping °· Read nutrition labels while you shop. This will help you make healthy decisions before you decide to purchase your food. °· Make a grocery list and stick to it. °Cooking °· Try to cook your favorite foods in a healthier way. For example, try baking instead of frying. °· Use low-fat dairy products. °Meal planning °· Use more fruits and vegetables. Half of your plate should be   fruits and vegetables. °· Include lean proteins like poultry and fish. °How do I count calories when eating out? °· Ask for smaller portion sizes. °· Consider sharing an entree and sides instead of getting your own entree. °· If you get your own entree, eat only half. Ask for a box at the beginning of your meal and put the rest of your entree in it so you are not tempted to eat it. °· If calories are listed on the menu, choose the lower calorie options. °· Choose dishes that include vegetables, fruits, whole grains, low-fat dairy products, and lean protein. °· Choose items that are boiled, broiled, grilled, or steamed. Stay away from items that are buttered, battered, fried, or served with cream sauce. Items labeled "crispy" are usually fried, unless stated otherwise. °· Choose water, low-fat milk, unsweetened iced tea, or other drinks without added sugar. If you want an alcoholic beverage, choose a lower calorie option such as a glass of wine or light beer. °· Ask for dressings, sauces, and syrups on the side. These are usually high in calories, so you should limit the amount you eat. °· If you want a salad, choose a garden salad and ask for grilled meats. Avoid extra toppings like bacon, cheese, or fried items. Ask for the dressing on the side, or ask for olive oil and vinegar or lemon to use as dressing. °· Estimate how many servings of a food you are given. For example, a serving of  cooked rice is ½ cup or about the size of half a baseball. Knowing serving sizes will help you be aware of how much food you are eating at restaurants. The list below tells you how big or small some common portion sizes are based on everyday objects: °? 1 oz--4 stacked dice. °? 3 oz--1 deck of cards. °? 1 tsp--1 die. °? 1 Tbsp--½ a ping-pong ball. °? 2 Tbsp--1 ping-pong ball. °? ½ cup--½ baseball. °? 1 cup--1 baseball. °Summary °· Calorie counting means keeping track of how many calories you eat and drink each day. If you eat fewer calories than your body needs, you should lose weight. °· A healthy amount of weight to lose per week is usually 1-2 lb (0.5-0.9 kg). This usually means reducing your daily calorie intake by 500-750 calories. °· The number of calories in a food can be found on a Nutrition Facts label. If a food does not have a Nutrition Facts label, try to look up the calories online or ask your dietitian for help. °· Use your calories on foods and drinks that will fill you up, and not on foods and drinks that will leave you hungry. °· Use smaller plates, glasses, and bowls to prevent overeating. °This information is not intended to replace advice given to you by your health care provider. Make sure you discuss any questions you have with your health care provider. °Document Released: 09/23/2005 Document Revised: 06/12/2018 Document Reviewed: 08/23/2016 °Elsevier Patient Education © 2020 [employer] ° °

## 2019-09-18 LAB — CBC WITH DIFFERENTIAL/PLATELET
Basophils Absolute: 0.1 10*3/uL (ref 0.0–0.2)
Basos: 1 %
EOS (ABSOLUTE): 0 10*3/uL (ref 0.0–0.4)
Eos: 1 %
Hematocrit: 42.4 % (ref 34.0–46.6)
Hemoglobin: 13.2 g/dL (ref 11.1–15.9)
Immature Grans (Abs): 0 10*3/uL (ref 0.0–0.1)
Immature Granulocytes: 0 %
Lymphocytes Absolute: 1.6 10*3/uL (ref 0.7–3.1)
Lymphs: 20 %
MCH: 25.8 pg — ABNORMAL LOW (ref 26.6–33.0)
MCHC: 31.1 g/dL — ABNORMAL LOW (ref 31.5–35.7)
MCV: 83 fL (ref 79–97)
Monocytes Absolute: 0.8 10*3/uL (ref 0.1–0.9)
Monocytes: 10 %
Neutrophils Absolute: 5.4 10*3/uL (ref 1.4–7.0)
Neutrophils: 68 %
Platelets: 282 10*3/uL (ref 150–450)
RBC: 5.12 x10E6/uL (ref 3.77–5.28)
RDW: 13.6 % (ref 11.7–15.4)
WBC: 7.9 10*3/uL (ref 3.4–10.8)

## 2019-09-18 LAB — CMP14+EGFR
ALT: 27 IU/L (ref 0–32)
AST: 80 IU/L — ABNORMAL HIGH (ref 0–40)
Albumin/Globulin Ratio: 1.1 — ABNORMAL LOW (ref 1.2–2.2)
Albumin: 4 g/dL (ref 3.8–4.9)
Alkaline Phosphatase: 167 IU/L — ABNORMAL HIGH (ref 39–117)
BUN/Creatinine Ratio: 9 (ref 9–23)
BUN: 8 mg/dL (ref 6–24)
Bilirubin Total: 0.8 mg/dL (ref 0.0–1.2)
CO2: 23 mmol/L (ref 20–29)
Calcium: 9.7 mg/dL (ref 8.7–10.2)
Chloride: 92 mmol/L — ABNORMAL LOW (ref 96–106)
Creatinine, Ser: 0.89 mg/dL (ref 0.57–1.00)
GFR calc Af Amer: 82 mL/min/{1.73_m2} (ref >59)
GFR calc non Af Amer: 71 mL/min/{1.73_m2} (ref >59)
Globulin, Total: 3.6 g/dL (ref 1.5–4.5)
Glucose: 109 mg/dL — ABNORMAL HIGH (ref 65–99)
Potassium: 3.7 mmol/L (ref 3.5–5.2)
Sodium: 131 mmol/L — ABNORMAL LOW (ref 134–144)
Total Protein: 7.6 g/dL (ref 6.0–8.5)

## 2019-09-18 LAB — LIPID PANEL
Chol/HDL Ratio: 3 ratio (ref 0.0–4.4)
Cholesterol, Total: 132 mg/dL (ref 100–199)
HDL: 44 mg/dL (ref >39)
LDL Chol Calc (NIH): 66 mg/dL (ref 0–99)
Triglycerides: 122 mg/dL (ref 0–149)
VLDL Cholesterol Cal: 22 mg/dL (ref 5–40)

## 2019-09-18 LAB — VITAMIN D 25 HYDROXY (VIT D DEFICIENCY, FRACTURES): Vit D, 25-Hydroxy: 8.9 ng/mL — ABNORMAL LOW (ref 30.0–100.0)

## 2019-09-18 LAB — HIV ANTIBODY (ROUTINE TESTING W REFLEX): HIV Screen 4th Generation wRfx: NONREACTIVE

## 2019-12-28 IMAGING — DX LEFT KNEE - COMPLETE 4+ VIEW
5 series · 5 of 5 positions shown · non-contrast
Comparison: 07/29/2018

CLINICAL DATA: Left knee pain due to fall

EXAM:
LEFT KNEE - COMPLETE 4+ VIEW

[knee ap]
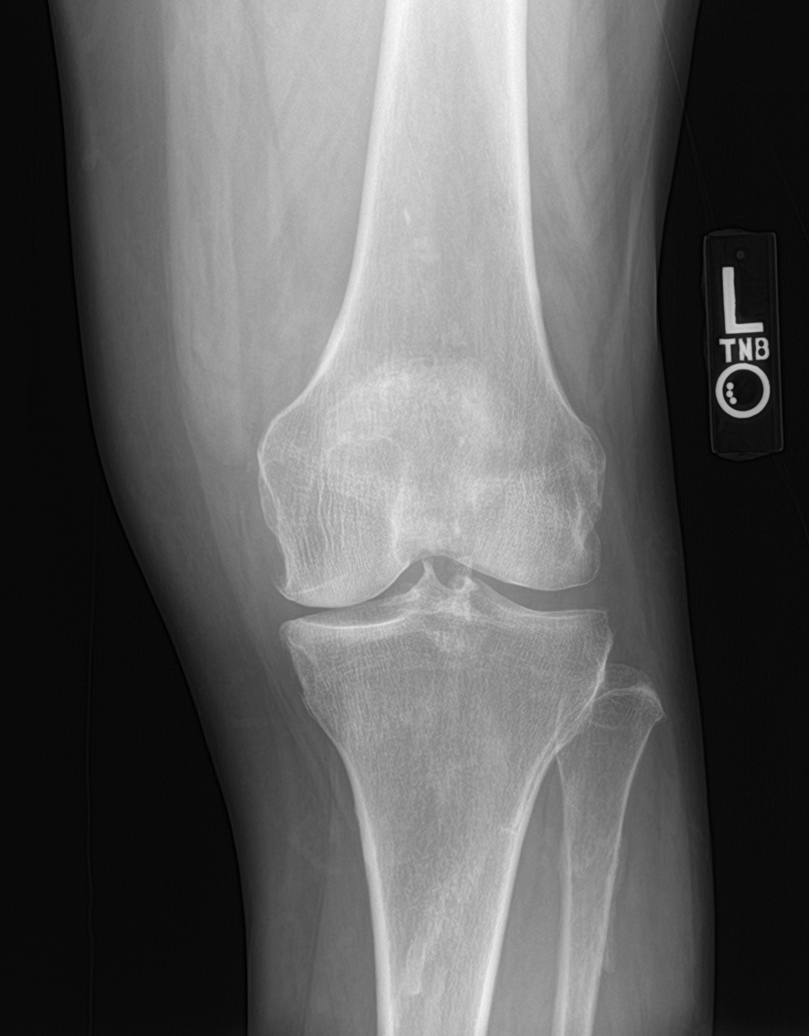

[knee obl (1 of 2)]
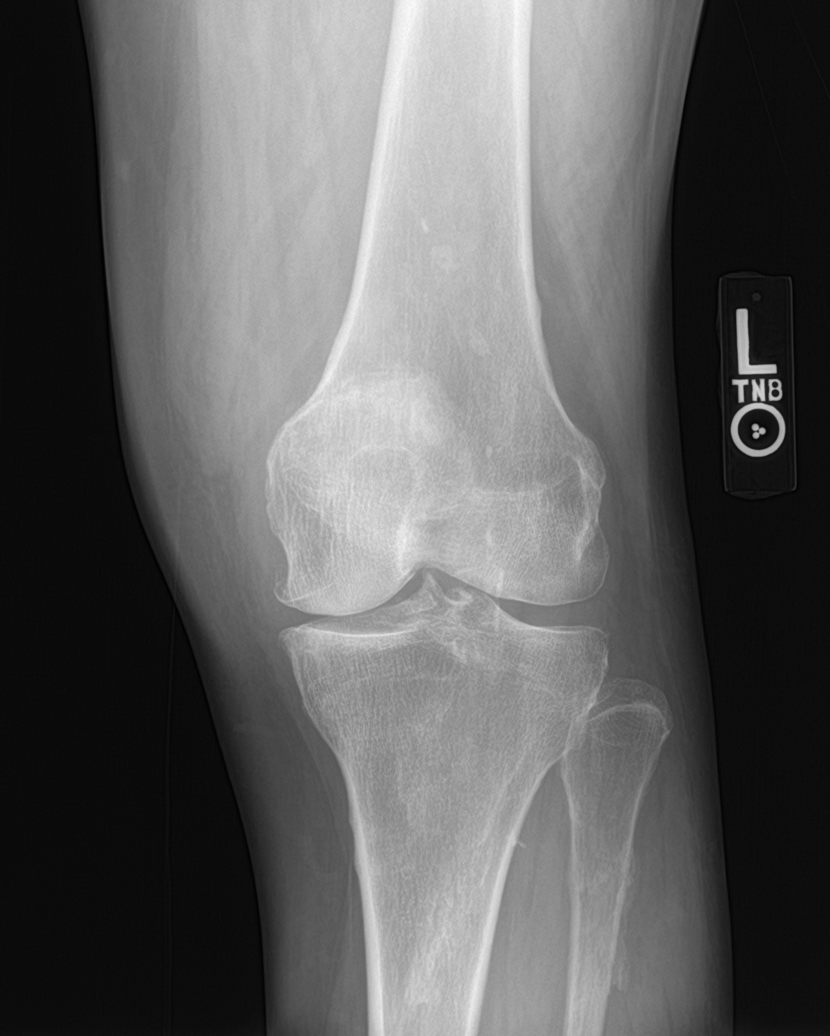

[knee obl (2 of 2)]
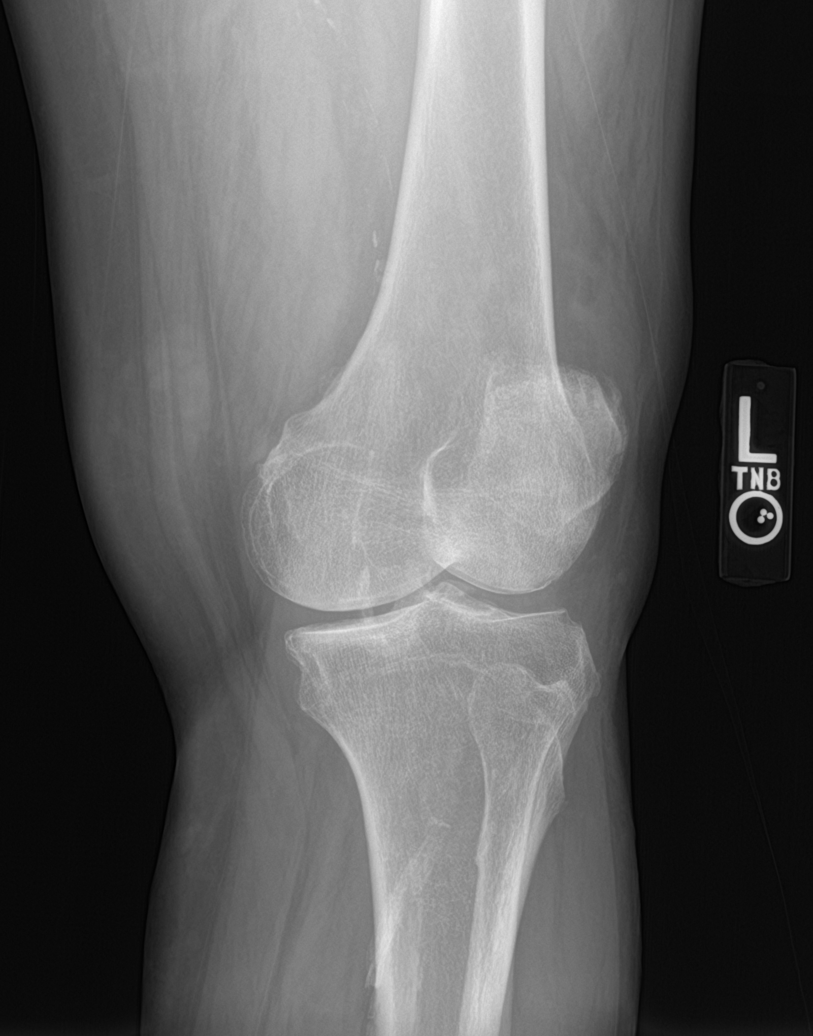

[knee sunrise]
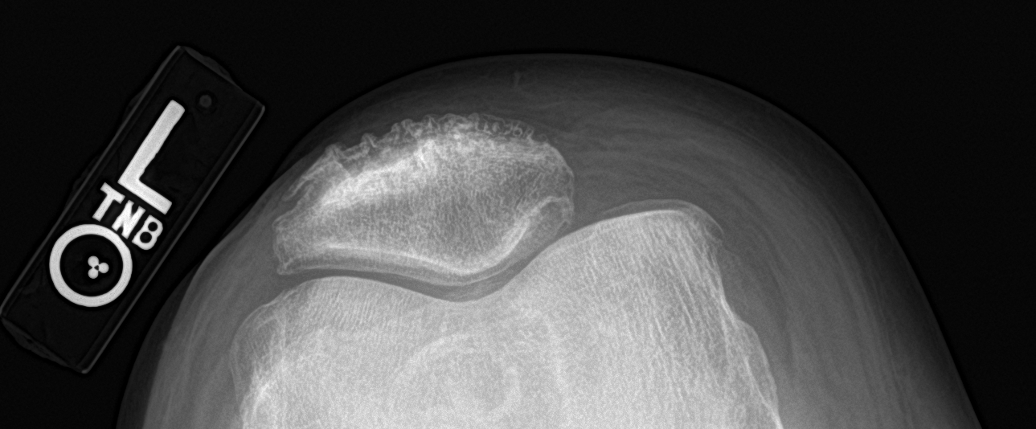

[knee lat]
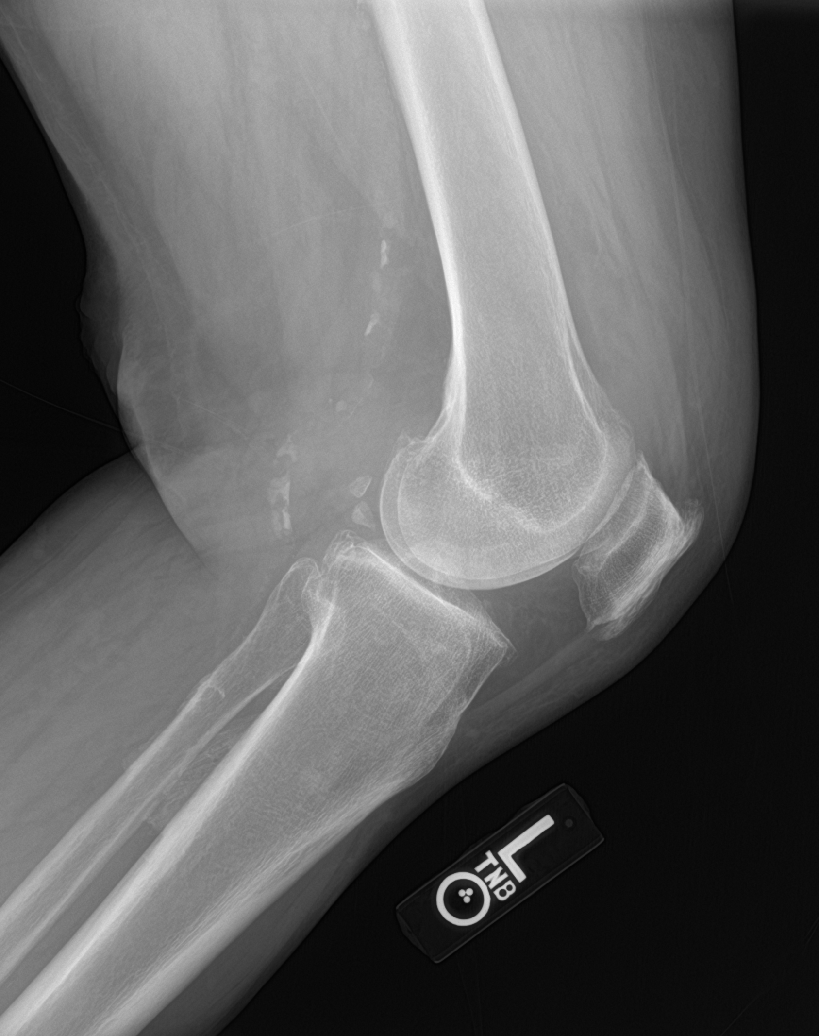

[5 of 5 positions shown; findings below may reference images not displayed]

FINDINGS: Probable small joint effusion. No acute fracture or malalignment.
Generalized degenerative marginal spurring. Atherosclerotic
calcification.
IMPRESSION: 1. Negative for fracture.
2. Degenerative spurring and small joint effusion.

## 2020-02-14 ENCOUNTER — Other Ambulatory Visit (INDEPENDENT_AMBULATORY_CARE_PROVIDER_SITE_OTHER): Payer: Self-pay | Admitting: Family Medicine

## 2020-05-16 ENCOUNTER — Other Ambulatory Visit (INDEPENDENT_AMBULATORY_CARE_PROVIDER_SITE_OTHER): Payer: Self-pay | Admitting: Family Medicine

## 2020-07-20 ENCOUNTER — Other Ambulatory Visit (INDEPENDENT_AMBULATORY_CARE_PROVIDER_SITE_OTHER): Payer: Self-pay | Admitting: Primary Care

## 2020-07-20 DIAGNOSIS — I10 Essential (primary) hypertension: Secondary | ICD-10-CM

## 2020-07-20 NOTE — Telephone Encounter (Signed)
Call to patient- scheduled follow up appointment- Courtesy RF given #[phone]

## 2020-08-04 ENCOUNTER — Other Ambulatory Visit: Payer: Self-pay

## 2020-08-04 ENCOUNTER — Ambulatory Visit (INDEPENDENT_AMBULATORY_CARE_PROVIDER_SITE_OTHER): Payer: 59 | Admitting: Primary Care

## 2020-08-04 ENCOUNTER — Encounter (INDEPENDENT_AMBULATORY_CARE_PROVIDER_SITE_OTHER): Payer: Self-pay | Admitting: Primary Care

## 2020-08-04 VITALS — BP 144/85 | HR 98 | Temp 97.9°F | Ht 64.0 in | Wt 183.6 lb

## 2020-08-04 DIAGNOSIS — I10 Essential (primary) hypertension: Secondary | ICD-10-CM | POA: Diagnosis not present

## 2020-08-04 DIAGNOSIS — F17200 Nicotine dependence, unspecified, uncomplicated: Secondary | ICD-10-CM | POA: Diagnosis not present

## 2020-08-04 DIAGNOSIS — Z1211 Encounter for screening for malignant neoplasm of colon: Secondary | ICD-10-CM

## 2020-08-04 DIAGNOSIS — Z1231 Encounter for screening mammogram for malignant neoplasm of breast: Secondary | ICD-10-CM

## 2020-08-04 DIAGNOSIS — Z760 Encounter for issue of repeat prescription: Secondary | ICD-10-CM

## 2020-08-04 DIAGNOSIS — Z79899 Other long term (current) drug therapy: Secondary | ICD-10-CM

## 2020-08-04 DIAGNOSIS — E559 Vitamin D deficiency, unspecified: Secondary | ICD-10-CM

## 2020-08-04 MED ORDER — LOSARTAN POTASSIUM 50 MG PO TABS: 50.0000 mg | ORAL_TABLET | Freq: Every day | ORAL | 1 refills | Status: DC

## 2020-08-04 MED ORDER — AMLODIPINE BESYLATE 10 MG PO TABS: ORAL_TABLET | ORAL | 1 refills | Status: DC

## 2020-08-04 MED ORDER — HYDROCHLOROTHIAZIDE 25 MG PO TABS: ORAL_TABLET | ORAL | 1 refills | Status: DC

## 2020-08-04 NOTE — Patient Instructions (Signed)

## 2020-08-05 LAB — CMP14+EGFR
ALT: 18 IU/L (ref 0–32)
AST: 46 IU/L — ABNORMAL HIGH (ref 0–40)
Albumin/Globulin Ratio: 1.1 — ABNORMAL LOW (ref 1.2–2.2)
Albumin: 4.3 g/dL (ref 3.8–4.9)
Alkaline Phosphatase: 159 IU/L — ABNORMAL HIGH (ref 44–121)
BUN/Creatinine Ratio: 5 — ABNORMAL LOW (ref 12–28)
BUN: 4 mg/dL — ABNORMAL LOW (ref 8–27)
Bilirubin Total: 0.7 mg/dL (ref 0.0–1.2)
CO2: 25 mmol/L (ref 20–29)
Calcium: 9.5 mg/dL (ref 8.7–10.3)
Chloride: 95 mmol/L — ABNORMAL LOW (ref 96–106)
Creatinine, Ser: 0.77 mg/dL (ref 0.57–1.00)
GFR calc Af Amer: 97 mL/min/{1.73_m2} (ref >59)
GFR calc non Af Amer: 84 mL/min/{1.73_m2} (ref >59)
Globulin, Total: 4 g/dL (ref 1.5–4.5)
Glucose: 140 mg/dL — ABNORMAL HIGH (ref 65–99)
Potassium: 3.5 mmol/L (ref 3.5–5.2)
Sodium: 135 mmol/L (ref 134–144)
Total Protein: 8.3 g/dL (ref 6.0–8.5)

## 2020-08-05 LAB — CBC WITH DIFFERENTIAL/PLATELET
Basophils Absolute: 0.1 10*3/uL (ref 0.0–0.2)
Basos: 1 %
EOS (ABSOLUTE): 0.1 10*3/uL (ref 0.0–0.4)
Eos: 1 %
Hematocrit: 43.6 % (ref 34.0–46.6)
Hemoglobin: 13.9 g/dL (ref 11.1–15.9)
Immature Grans (Abs): 0 10*3/uL (ref 0.0–0.1)
Immature Granulocytes: 0 %
Lymphocytes Absolute: 2.4 10*3/uL (ref 0.7–3.1)
Lymphs: 27 %
MCH: 25 pg — ABNORMAL LOW (ref 26.6–33.0)
MCHC: 31.9 g/dL (ref 31.5–35.7)
MCV: 78 fL — ABNORMAL LOW (ref 79–97)
Monocytes Absolute: 1 10*3/uL — ABNORMAL HIGH (ref 0.1–0.9)
Monocytes: 11 %
Neutrophils Absolute: 5.2 10*3/uL (ref 1.4–7.0)
Neutrophils: 60 %
Platelets: 288 10*3/uL (ref 150–450)
RBC: 5.57 x10E6/uL — ABNORMAL HIGH (ref 3.77–5.28)
RDW: 13.7 % (ref 11.7–15.4)
WBC: 8.8 10*3/uL (ref 3.4–10.8)

## 2020-08-05 LAB — VITAMIN D 25 HYDROXY (VIT D DEFICIENCY, FRACTURES): Vit D, 25-Hydroxy: 12.7 ng/mL — ABNORMAL LOW (ref 30.0–100.0)

## 2020-08-07 NOTE — Progress Notes (Signed)
Established Patient Office Visit  Subjective:  Patient ID: Yvette Davis, female    DOB: [birth year]  Age: 59 y.o. MRN: [id number]  CC:  Chief Complaint  Patient presents with  . Hypertension    HPI Yvette Davis is a 59 year  Old obese female who presents for management of Hypertension. Denies shortness of breath, headaches, chest pain or lower extremity edema  Past Medical History:  Diagnosis Date  . Endometriosis   . Hypertension     Past Surgical History:  Procedure Laterality Date  . ABDOMINAL HYSTERECTOMY    . CHOLECYSTECTOMY    . CHOLECYSTECTOMY    . COLONOSCOPY      Family History  Family history unknown: Yes    Social History   Socioeconomic History  . Marital status: Single    Spouse name: Not on file  . Number of children: Not on file  . Years of education: Not on file  . Highest education level: Not on file  Occupational History  . Not on file  Tobacco Use  . Smoking status: Current Every Day Smoker    Packs/day: 0.25    Years: 31.00    Pack years: 7.75  . Smokeless tobacco: Never Used  Substance and Sexual Activity  . Alcohol use: Yes    Comment: Drinks beer now and then  . Drug use: No  . Sexual activity: Never  Other Topics Concern  . Not on file  Social History Narrative  . Not on file   Social Determinants of Health   Financial Resource Strain:   . Difficulty of Paying Living Expenses: Not on file  Food Insecurity:   . Worried About Charity fundraiser in the Last Year: Not on file  . Ran Out of Food in the Last Year: Not on file  Transportation Needs:   . Lack of Transportation (Medical): Not on file  . Lack of Transportation (Non-Medical): Not on file  Physical Activity:   . Days of Exercise per Week: Not on file  . Minutes of Exercise per Session: Not on file  Stress:   . Feeling of Stress : Not on file  Social Connections:   . Frequency of Communication with Friends and Family: Not on file  . Frequency of  Social Gatherings with Friends and Family: Not on file  . Attends Religious Services: Not on file  . Active Member of Clubs or Organizations: Not on file  . Attends Archivist Meetings: Not on file  . Marital Status: Not on file  Intimate Partner Violence:   . Fear of Current or Ex-Partner: Not on file  . Emotionally Abused: Not on file  . Physically Abused: Not on file  . Sexually Abused: Not on file    Outpatient Medications Prior to Visit  Medication Sig Dispense Refill  . amLODipine (NORVASC) 10 MG tablet TAKE 1 TABLET(10 MG) BY MOUTH DAILY 30 tablet 0  . hydrochlorothiazide (HYDRODIURIL) 25 MG tablet TAKE 1 TABLET(25 MG) BY MOUTH DAILY 30 tablet 0  . diclofenac sodium (VOLTAREN) 1 % GEL Apply 4 g topically 4 (four) times daily. To affected area as needed. (Patient not taking: Reported on 05/19/2019) 150 g 0  . losartan (COZAAR) 50 MG tablet Take 1 tablet (50 mg total) by mouth daily. Must have office visit for refills (Patient not taking: Reported on 08/04/2020) 90 tablet 0   No facility-administered medications prior to visit.    Allergies  Allergen Reactions  . Meperidine  Hcl     REACTION: delirious  . Morphine     REACTION: delirious  . Penicillins     REACTION: throat closes  . Sulfonamide Derivatives     REACTION: itching    ROS Review of Systems  All other systems reviewed and are negative.     Objective:    Physical Exam Vitals reviewed.  Constitutional:      Appearance: She is obese.  HENT:     Head: Normocephalic.     Right Ear: Tympanic membrane normal.     Left Ear: Tympanic membrane normal.     Nose: Nose normal.  Cardiovascular:     Rate and Rhythm: Normal rate and regular rhythm.  Pulmonary:     Effort: Pulmonary effort is normal.     Breath sounds: Normal breath sounds.  Abdominal:     General: Bowel sounds are normal. There is distension.     Palpations: Abdomen is soft.  Musculoskeletal:        General: Normal range of motion.      Cervical back: Normal range of motion and neck supple.  Skin:    General: Skin is warm and dry.  Neurological:     Mental Status: She is alert and oriented to person, place, and time.  Psychiatric:        Mood and Affect: Mood normal.        Behavior: Behavior normal.        Thought Content: Thought content normal.        Judgment: Judgment normal.    BP (!) 144/85 (BP Location: Left Arm, Patient Position: Sitting, Cuff Size: Normal)   Pulse 98   Temp 97.9 F (36.6 C) (Temporal)   Ht _0  (1.626 m)   Wt 183 lb 9.6 oz (83.3 kg)   SpO2 98%   BMI 31.51 kg/m  Wt Readings from Last 3 Encounters:  08/04/20 183 lb 9.6 oz (83.3 kg)  09/17/19 188 lb 6.4 oz (85.5 kg)  05/19/19 194 lb 6.4 oz (88.2 kg)     Health Maintenance Due  Topic Date Due  . COVID-19 Vaccine (1) Never done  . PAP SMEAR-Modifier  Never done  . MAMMOGRAM  08/24/2010  . COLONOSCOPY  12/07/2019    There are no preventive care reminders to display for this patient.  No results found for: TSH Lab Results  Component Value Date   WBC 8.8 08/04/2020   HGB 13.9 08/04/2020   HCT 43.6 08/04/2020   MCV 78 (L) 08/04/2020   PLT 288 08/04/2020   Lab Results  Component Value Date   NA 135 08/04/2020   K 3.5 08/04/2020   CO2 25 08/04/2020   GLUCOSE 140 (H) 08/04/2020   BUN 4 (L) 08/04/2020   CREATININE 0.77 08/04/2020   BILITOT 0.7 08/04/2020   ALKPHOS 159 (H) 08/04/2020   AST 46 (H) 08/04/2020   ALT 18 08/04/2020   PROT 8.3 08/04/2020   ALBUMIN 4.3 08/04/2020   CALCIUM 9.5 08/04/2020   ANIONGAP 9 08/27/2016   Lab Results  Component Value Date   CHOL 132 09/17/2019   Lab Results  Component Value Date   HDL 44 09/17/2019   Lab Results  Component Value Date   LDLCALC 66 09/17/2019   Lab Results  Component Value Date   TRIG 122 09/17/2019   Lab Results  Component Value Date   CHOLHDL 3.0 09/17/2019   No results found for: HGBA1C    Assessment & Plan:  Yvette Davis was  seen today for  hypertension.  Diagnoses and all orders for this visit:  Essential hypertension Counseled on blood pressure goal of less than 130/80, low-sodium, DASH diet, medication compliance, 150 minutes of moderate intensity exercise per week.  -     amLODipine (NORVASC) 10 MG tablet; TAKE 1 TABLET(10 MG) BY MOUTH DAILY -     hydrochlorothiazide (HYDRODIURIL) 25 MG tablet; TAKE 1 TABLET(25 MG) BY MOUTH DAILY -     CBC with Differential -     CMP14+EGFR  Colon cancer screening Normal colon cancer screening.  CDC recommends colorectal screening from ages 81-75 -     Ambulatory referral to Gastroenterology -     CBC with Differential -     CMP14+EGFR  Encounter for screening mammogram for malignant neoplasm of breast Breast cancer screening start at age of 60 years old   Tobacco use disorder She is aware of increased risk for lung cancer and other respiratory diseases recommend cessation. Risk for respiratory complication , CVD, and DM This will be reminded at each clinical visit.  Vitamin D deficiency -     Vitamin D, 25-hydroxy  Medication management  CBC CMP Lipids  Medication refill -     losartan (COZAAR) 50 MG tablet; Take 1 tablet (50 mg total) by mouth daily. -     amLODipine (NORVASC) 10 MG tablet; TAKE 1 TABLET(10 MG) BY MOUTH DAILY -     hydrochlorothiazide (HYDRODIURIL) 25 MG tablet; TAKE 1 TABLET(25 MG) BY MOUTH DAILY    Meds ordered this encounter  Medications  . losartan (COZAAR) 50 MG tablet    Sig: Take 1 tablet (50 mg total) by mouth daily.    Dispense:  90 tablet    Refill:  1    Must have office visit for refills  . amLODipine (NORVASC) 10 MG tablet    Sig: TAKE 1 TABLET(10 MG) BY MOUTH DAILY    Dispense:  90 tablet    Refill:  1  . hydrochlorothiazide (HYDRODIURIL) 25 MG tablet    Sig: TAKE 1 TABLET(25 MG) BY MOUTH DAILY    Dispense:  90 tablet    Refill:  1    Follow-up: Return in about 6 weeks (around 09/15/2020) for Tele Bp ckeck.    Kerin Perna, NP

## 2020-08-08 ENCOUNTER — Other Ambulatory Visit (INDEPENDENT_AMBULATORY_CARE_PROVIDER_SITE_OTHER): Payer: Self-pay | Admitting: Primary Care

## 2020-08-08 ENCOUNTER — Telehealth (INDEPENDENT_AMBULATORY_CARE_PROVIDER_SITE_OTHER): Payer: Self-pay

## 2020-08-08 MED ORDER — ERGOCALCIFEROL 1.25 MG (50000 UT) PO CAPS: 50000.0000 [IU] | ORAL_CAPSULE | ORAL | 0 refills | Status: DC

## 2020-08-08 MED ORDER — VITAMIN D3 50 MCG (2000 UT) PO CAPS: 2000.0000 [IU] | ORAL_CAPSULE | Freq: Every day | ORAL | 1 refills | Status: DC

## 2020-08-08 NOTE — Telephone Encounter (Signed)
Patient is aware that her vitamin D is low. Prescription for 50,000 iu has been sent to pharmacy. Advised patient to take that for six weeks then Rx for 2000 iu will be sent. She verbalized understanding. Nat Christen, CMA

## 2020-08-08 NOTE — Telephone Encounter (Signed)
-----   Message from Kerin Perna, NP sent at 08/08/2020 11:38 AM EDT ----- Vit D low sent in 50,000 iu weekly for 6 weeks than 2000 daily Vitamin D is needed to make and keep bones strong.

## 2020-08-08 NOTE — Progress Notes (Unsigned)
amin

## 2020-09-15 ENCOUNTER — Telehealth (INDEPENDENT_AMBULATORY_CARE_PROVIDER_SITE_OTHER): Payer: 59 | Admitting: Primary Care

## 2020-11-07 ENCOUNTER — Other Ambulatory Visit: Payer: Self-pay

## 2020-11-07 ENCOUNTER — Ambulatory Visit (AMBULATORY_SURGERY_CENTER): Payer: 59 | Admitting: *Deleted

## 2020-11-07 VITALS — Ht 64.0 in | Wt 170.0 lb

## 2020-11-07 DIAGNOSIS — Z800 Family history of malignant neoplasm of digestive organs: Secondary | ICD-10-CM

## 2020-11-07 NOTE — Progress Notes (Signed)
Pt verified name, DOB, address and insurance during PV today. Pt mailed instruction packet to included paper to complete and mail back to [facility] with addressed and stamped envelope, Emmi video, copy of consent form to read and not return, and instructions.  PV completed over the phone. Pt encouraged to call with questions or issues   No egg or soy allergy known to patient  No issues with past sedation with any surgeries or procedures No intubation problems in the past  No FH of Malignant Hyperthermia No diet pills per patient No home 02 use per patient  No blood thinners per patient  Pt denies issues with constipation  No A fib or A flutter  EMMI video to pt or via MyChart  COVID 19 guidelines implemented in PV today with Pt and RN  Pt is fully vaccinated  for Covid   Due to the COVID-19 pandemic we are asking patients to follow certain guidelines.  Pt aware of COVID protocols and [facility] guidelines   

## 2020-11-21 ENCOUNTER — Other Ambulatory Visit: Payer: Self-pay

## 2020-11-21 ENCOUNTER — Ambulatory Visit (AMBULATORY_SURGERY_CENTER): Payer: 59 | Admitting: Internal Medicine

## 2020-11-21 ENCOUNTER — Encounter: Payer: Self-pay | Admitting: Internal Medicine

## 2020-11-21 VITALS — BP 140/86 | HR 71 | Temp 98.6°F | Resp 23 | Ht 64.0 in | Wt 170.0 lb

## 2020-11-21 DIAGNOSIS — D122 Benign neoplasm of ascending colon: Secondary | ICD-10-CM | POA: Diagnosis not present

## 2020-11-21 DIAGNOSIS — Z800 Family history of malignant neoplasm of digestive organs: Secondary | ICD-10-CM

## 2020-11-21 DIAGNOSIS — D120 Benign neoplasm of cecum: Secondary | ICD-10-CM

## 2020-11-21 DIAGNOSIS — Z86010 Personal history of colonic polyps: Secondary | ICD-10-CM | POA: Diagnosis not present

## 2020-11-21 MED ORDER — SODIUM CHLORIDE 0.9 % IV SOLN: 500.0000 mL | Freq: Once | INTRAVENOUS | Status: DC

## 2020-11-21 NOTE — Op Note (Signed)
[place] Patient Name: Yvette Davis Procedure Date: 11/21/2020 11:30 AM MRN: [id number] Endoscopist: Gatha Mayer , MD Age: 60 Referring MD:  Date of Birth: 14-Sep-1961 Gender: Female Account #: [id number] Procedure:                Colonoscopy Indications:              Surveillance: Personal history of adenomatous                            polyps on last colonoscopy > 5 years ago Medicines:                Propofol per Anesthesia, Monitored Anesthesia Care Procedure:                Pre-Anesthesia Assessment:                           - Prior to the procedure, a History and Physical                            was performed, and patient medications and                            allergies were reviewed. The patient's tolerance of                            previous anesthesia was also reviewed. The risks                            and benefits of the procedure and the sedation                            options and risks were discussed with the patient.                            All questions were answered, and informed consent                            was obtained. Prior Anticoagulants: The patient has                            taken no previous anticoagulant or antiplatelet                            agents. ASA Grade Assessment: II - A patient with                            mild systemic disease. After reviewing the risks                            and benefits, the patient was deemed in                            satisfactory condition to undergo the procedure.  After obtaining informed consent, the colonoscope                            was passed under direct vision. Throughout the                            procedure, the patient's blood pressure, pulse, and                            oxygen saturations were monitored continuously. The                            Olympus CF-HQ190 [id number]) Colonoscope was                             introduced through the anus and advanced to the the                            cecum, identified by appendiceal orifice and                            ileocecal valve. The colonoscopy was somewhat                            difficult due to positioning to remove polyps.                            Successful completion of the procedure was aided by                            straightening and shortening the scope to obtain                            bowel loop reduction. The patient tolerated the                            procedure well. The quality of the bowel                            preparation was adequate. The ileocecal valve,                            appendiceal orifice, and rectum were photographed.                            The bowel preparation used was Miralax via split                            dose instruction. Scope In: 11:49:50 AM Scope Out: 12:42:35 PM Scope Withdrawal Time: 0 hours 48 minutes 5 seconds  Total Procedure Duration: 0 hours 52 minutes 45 seconds  Findings:                 The perianal and digital rectal examinations were  normal.                           A 30 mm polyp was found in the ascending colon. The                            polyp was semi-pedunculated. The polyp was removed                            with a piecemeal technique using a hot snare.                            Resection and retrieval were complete. Verification                            of patient identification for the specimen was                            done. Estimated blood loss: none. To prevent                            bleeding after the polypectomy, four hemostatic                            clips were successfully placed (MR conditional).                            There was no bleeding during, or at the end, of the                            procedure. SPOT tattoo injected submucosal just                            distal to this. 3 cc                            A 35 mm polyp was found in the ascending colon. The                            polyp was sessile. The polyp was removed with a                            piecemeal technique using a hot snare. Resection                            and retrieval were complete. Verification of                            patient identification for the specimen was done.                            Estimated blood loss was minimal. To prevent  bleeding after the polypectomy, four hemostatic                            clips were successfully placed (MR conditional).                            There was no bleeding at the end of the procedure.                           Two sessile polyps were found in the ascending                            colon and cecum. The polyps were diminutive in                            size. These polyps were removed with a cold snare.                            Resection and retrieval were complete. Verification                            of patient identification for the specimen was                            done. Estimated blood loss was minimal.                           A few diverticula were found in the ascending colon.                           The exam was otherwise without abnormality on                            direct and retroflexion views. Complications:            No immediate complications. Estimated Blood Loss:     Estimated blood loss was minimal. Impression:               - One 30 mm polyp in the ascending colon, removed                            piecemeal using a hot snare. Resected and                            retrieved. Clips (MR conditional) were placed. SPOT                            tattoo injected submucosal just distal to this. 3 cc                           - One 35 mm polyp in the ascending colon, removed                            piecemeal  using a hot snare. Resected and                             retrieved. Clips (MR conditional) were placed.                           - Two diminutive polyps in the ascending colon and                            in the cecum, removed with a cold snare. Resected                            and retrieved.                           - Diverticulosis in the ascending colon.                           - The examination was otherwise normal on direct                            and retroflexion views.                           - Personal history of colonic polyps. Recommendation:           - Patient has a contact number available for                            emergencies. The signs and symptoms of potential                            delayed complications were discussed with the                            patient. Return to normal activities tomorrow.                            Written discharge instructions were provided to the                            patient.                           - Resume previous diet.                           - Continue present medications.                           - No aspirin, ibuprofen, naproxen, or other                            non-steroidal anti-inflammatory drugs for 2 weeks  after polyp removal.                           - Await pathology results.                           - Repeat colonoscopy is recommended for                            surveillance. The colonoscopy date will be                            determined after pathology results from today's                            exam become available for review. Gatha Mayer, MD 11/21/2020 12:53:27 PM This report has been signed electronically.

## 2020-11-21 NOTE — Progress Notes (Signed)
Called to room to assist during endoscopic procedure.  Patient ID and intended procedure confirmed with present staff. Received instructions for my participation in the procedure from the performing physician.  

## 2020-11-21 NOTE — Progress Notes (Signed)
VS by CW  I have reviewed the patient's medical history in detail and updated the computerized patient record.  

## 2020-11-21 NOTE — Patient Instructions (Addendum)
I found and removed 4 polyps.  Two were large.  They all look benign but will wait to see pathology to know for sure.  You will need another colonoscopy this year to make sure the polyps are all gone unless the pathology reveals cancer (I doubt it will).  I appreciate the opportunity to care for you. Gatha Mayer, MD, [facility]  No NSAIDS: ibuprofen, aleve and aspirin for two weeks to prevent bleeding.  You have metal clips in your colon.  They will fall out in time with your BM.  In the meantime, you can't have an MRI.  Please, keep the cards given to you by your recovery room nurse in your wallet.  You will probably need another colonoscopy in 3-6 months.  Read all of the handouts given to you by your recovery room nurse.   YOU HAD AN ENDOSCOPIC PROCEDURE TODAY AT [facility]:   Refer to the procedure report that was given to you for any specific questions about what was found during the examination.  If the procedure report does not answer your questions, please call your gastroenterologist to clarify.  If you requested that your care partner not be given the details of your procedure findings, then the procedure report has been included in a sealed envelope for you to review at your convenience later.  YOU SHOULD EXPECT: Some feelings of bloating in the abdomen. Passage of more gas than usual.  Walking can help get rid of the air that was put into your GI tract during the procedure and reduce the bloating. If you had a lower endoscopy (such as a colonoscopy or flexible sigmoidoscopy) you may notice spotting of blood in your stool or on the toilet paper. If you underwent a bowel prep for your procedure, you may not have a normal bowel movement for a few days.  Please Note:  You might notice some irritation and congestion in your nose or some drainage.  This is from the oxygen used during your procedure.  There is no need for concern and it should clear up in a day or  so.  SYMPTOMS TO REPORT IMMEDIATELY:   Following lower endoscopy (colonoscopy or flexible sigmoidoscopy):  Excessive amounts of blood in the stool  Significant tenderness or worsening of abdominal pains  Swelling of the abdomen that is new, acute  Fever of 100F or higher   For urgent or emergent issues, a gastroenterologist can be reached at any hour by calling [phone]. Do not use MyChart messaging for urgent concerns.    DIET:  We do recommend a small meal at first, but then you may proceed to your regular diet.  Drink plenty of fluids but you should avoid alcoholic beverages for 24 hours.  ACTIVITY:  You should plan to take it easy for the rest of today and you should NOT DRIVE or use heavy machinery until tomorrow (because of the sedation medicines used during the test).    FOLLOW UP: Our staff will call the number listed on your records 48-72 hours following your procedure to check on you and address any questions or concerns that you may have regarding the information given to you following your procedure. If we do not reach you, we will leave a message.  We will attempt to reach you two times.  During this call, we will ask if you have developed any symptoms of COVID 19. If you develop any symptoms (ie: fever, flu-like symptoms, shortness of breath, cough  etc.) before then, please call [phone].  If you test positive for Covid 19 in the 2 weeks post procedure, please call and report this information to [place].    If any biopsies were taken you will be contacted by phone or by letter within the next 1-3 weeks.  Please call us at [phone] if you have not heard about the biopsies in 3 weeks.    SIGNATURES/CONFIDENTIALITY: You and/or your care partner have signed paperwork which will be entered into your electronic medical record.  These signatures attest to the fact that that the information above on your After Visit Summary has been reviewed and is understood.  Full  responsibility of the confidentiality of this discharge information lies with you and/or your care-partner.

## 2020-11-21 NOTE — Progress Notes (Signed)
Pirin, aleve or ibuprofen for 2 weeks to help present bleeding.

## 2020-11-21 NOTE — Progress Notes (Signed)
PT taken to PACU. Monitors in place. VSS. Report given to RN. 

## 2020-11-23 ENCOUNTER — Telehealth: Payer: Self-pay | Admitting: *Deleted

## 2020-11-23 ENCOUNTER — Telehealth: Payer: Self-pay

## 2020-11-23 NOTE — Telephone Encounter (Signed)
  Follow up Call-  Call back number 11/21/2020  Post procedure Call Back phone  # [phone]  Permission to leave phone message Yes  Some recent data might be hidden     Patient questions:  VM box has not been set up.

## 2020-11-23 NOTE — Telephone Encounter (Signed)
Patient called states she is well no questions at this time.

## 2020-11-23 NOTE — Telephone Encounter (Signed)
No answer on follow up call. No voicemail.  

## 2020-12-07 ENCOUNTER — Encounter: Payer: Self-pay | Admitting: Internal Medicine

## 2020-12-07 DIAGNOSIS — Z86010 Personal history of colonic polyps: Secondary | ICD-10-CM

## 2020-12-07 DIAGNOSIS — Z860101 Personal history of adenomatous and serrated colon polyps: Secondary | ICD-10-CM

## 2020-12-07 HISTORY — DX: Personal history of adenomatous and serrated colon polyps: Z86.0101

## 2020-12-07 HISTORY — DX: Personal history of colonic polyps: Z86.010

## 2021-02-26 ENCOUNTER — Other Ambulatory Visit (INDEPENDENT_AMBULATORY_CARE_PROVIDER_SITE_OTHER): Payer: Self-pay | Admitting: Primary Care

## 2021-02-26 DIAGNOSIS — Z760 Encounter for issue of repeat prescription: Secondary | ICD-10-CM

## 2021-02-27 ENCOUNTER — Other Ambulatory Visit (INDEPENDENT_AMBULATORY_CARE_PROVIDER_SITE_OTHER): Payer: Self-pay | Admitting: Primary Care

## 2021-02-27 DIAGNOSIS — Z760 Encounter for issue of repeat prescription: Secondary | ICD-10-CM

## 2021-02-27 DIAGNOSIS — I10 Essential (primary) hypertension: Secondary | ICD-10-CM

## 2021-02-27 NOTE — Telephone Encounter (Signed)
Courtesy refill. Patient needs office visit for further refills. Requested Prescriptions  Pending Prescriptions Disp Refills  . amLODipine (NORVASC) 10 MG tablet [Pharmacy Med Name: AMLODIPINE BESYLATE 10MG  TABLETS] 30 tablet 0    Sig: TAKE 1 TABLET(10 MG) BY MOUTH DAILY     Cardiovascular:  Calcium Channel Blockers Failed - 02/27/2021  7:50 PM      Failed - Last BP in normal range    BP Readings from Last 1 Encounters:  11/21/20 140/86         Failed - Valid encounter within last 6 months    Recent Outpatient Visits          6 months ago Essential hypertension   [place] Kerin Perna, NP   1 year ago Essential hypertension   [place], [place] P, NP   1 year ago Essential hypertension   [place], [place], NP   2 years ago Essential hypertension   [place], Roger David, PA-C   2 years ago Essential hypertension   [place], Roger David, PA-C             . Vitamin D, Ergocalciferol, (DRISDOL) 1.25 MG (50000 UNIT) CAPS capsule [Pharmacy Med Name: VITAMIN D2 50,000IU (ERGO) CAP RX] 6 capsule 0    Sig: TAKE 1 CAPSULE BY MOUTH 1 TIME A WEEK     Endocrinology:  Vitamins - Vitamin D Supplementation Failed - 02/27/2021  7:50 PM      Failed - 50,000 IU strengths are not delegated      Failed - Phosphate in normal range and within 360 days    No results found for: PHOS       Failed - Vitamin D in normal range and within 360 days    Vit D, 25-Hydroxy  Date Value Ref Range Status  08/04/2020 12.7 (L) 30.0 - 100.0 ng/mL Final    Comment:    Vitamin D deficiency has been defined by the Institute of Medicine and an Endocrine Society practice guideline as a level of serum 25-OH vitamin D less than 20 ng/mL (1,2). The Endocrine Society went on to further define vitamin D insufficiency as a level between 21 and 29 ng/mL  (2). 1. IOM (Institute of Medicine). 2010. Dietary reference    intakes for calcium and D. [place]: The    [employer]. 2. Holick MF, Binkley [place], Bischoff-Ferrari HA, et al.    Evaluation, treatment, and prevention of vitamin D    deficiency: an Endocrine Society clinical practice    guideline. JCEM. 2011 Jul; 96(7):1911-30.          Passed - Ca in normal range and within 360 days    Calcium  Date Value Ref Range Status  08/04/2020 9.5 8.7 - 10.3 mg/dL Final   Calcium, Ion  Date Value Ref Range Status  06/09/2018 1.13 (L) 1.15 - 1.40 mmol/L Final         Passed - Valid encounter within last 12 months    Recent Outpatient Visits          6 months ago Essential hypertension   [place] Kerin Perna, NP   1 year ago Essential hypertension   [place], [place], NP   1 year ago Essential hypertension   [place] Kerin Perna, NP  2 years ago Essential hypertension   [place], Roger David, PA-C   2 years ago Essential hypertension   [place], Roger David, PA-C             . hydrochlorothiazide (HYDRODIURIL) 25 MG tablet [Pharmacy Med Name: HYDROCHLOROTHIAZIDE 25MG  TABLETS] 30 tablet 0    Sig: TAKE 1 TABLET(25 MG) BY MOUTH DAILY     Cardiovascular: Diuretics - Thiazide Failed - 02/27/2021  7:50 PM      Failed - Last BP in normal range    BP Readings from Last 1 Encounters:  11/21/20 140/86         Failed - Valid encounter within last 6 months    Recent Outpatient Visits          6 months ago Essential hypertension   [place] Kerin Perna, NP   1 year ago Essential hypertension   [place], [place], NP   1 year ago Essential hypertension   [place], [place], NP   2 years ago  Essential hypertension   [place], Roger David, PA-C   2 years ago Essential hypertension   [place], Roger David, PA-C             Passed - Ca in normal range and within 360 days    Calcium  Date Value Ref Range Status  08/04/2020 9.5 8.7 - 10.3 mg/dL Final   Calcium, Ion  Date Value Ref Range Status  06/09/2018 1.13 (L) 1.15 - 1.40 mmol/L Final         Passed - Cr in normal range and within 360 days    Creatinine, Ser  Date Value Ref Range Status  08/04/2020 0.77 0.57 - 1.00 mg/dL Final         Passed - K in normal range and within 360 days    Potassium  Date Value Ref Range Status  08/04/2020 3.5 3.5 - 5.2 mmol/L Final         Passed - Na in normal range and within 360 days    Sodium  Date Value Ref Range Status  08/04/2020 135 134 - 144 mmol/L Final         Refused Prescriptions Disp Refills  . losartan (COZAAR) 50 MG tablet [Pharmacy Med Name: LOSARTAN 50MG  TABLETS] 90 tablet 1    Sig: TAKE 1 TABLET(50 MG) BY MOUTH DAILY     Cardiovascular:  Angiotensin Receptor Blockers Failed - 02/27/2021  7:50 PM      Failed - Cr in normal range and within 180 days    Creatinine, Ser  Date Value Ref Range Status  08/04/2020 0.77 0.57 - 1.00 mg/dL Final         Failed - K in normal range and within 180 days    Potassium  Date Value Ref Range Status  08/04/2020 3.5 3.5 - 5.2 mmol/L Final         Failed - Last BP in normal range    BP Readings from Last 1 Encounters:  11/21/20 140/86         Failed - Valid encounter within last 6 months    Recent Outpatient Visits          6 months ago Essential hypertension   [place] Kerin Perna, NP   1 year  ago Essential hypertension   [place], [place], NP   1 year ago Essential hypertension   [place] Kerin Perna, NP   2 years ago Essential hypertension    [place] Clent [place], PA-C   2 years ago Essential hypertension   [place], Roger David, PA-C             Passed - Patient is not pregnant

## 2021-02-27 NOTE — Telephone Encounter (Signed)
Requested medications are due for refill today.  yes  Requested medications are on the active medications list.  yes  Last refill. 08/08/2020  Future visit scheduled.   no  Notes to clinic.  Medication not delegated.

## 2021-02-28 ENCOUNTER — Other Ambulatory Visit (INDEPENDENT_AMBULATORY_CARE_PROVIDER_SITE_OTHER): Payer: Self-pay | Admitting: Family Medicine

## 2021-02-28 ENCOUNTER — Other Ambulatory Visit (INDEPENDENT_AMBULATORY_CARE_PROVIDER_SITE_OTHER): Payer: Self-pay | Admitting: Primary Care

## 2021-02-28 DIAGNOSIS — Z760 Encounter for issue of repeat prescription: Secondary | ICD-10-CM

## 2021-02-28 DIAGNOSIS — I10 Essential (primary) hypertension: Secondary | ICD-10-CM

## 2021-04-04 ENCOUNTER — Other Ambulatory Visit (INDEPENDENT_AMBULATORY_CARE_PROVIDER_SITE_OTHER): Payer: Self-pay | Admitting: Family Medicine

## 2021-04-04 ENCOUNTER — Other Ambulatory Visit (INDEPENDENT_AMBULATORY_CARE_PROVIDER_SITE_OTHER): Payer: Self-pay | Admitting: Primary Care

## 2021-04-04 DIAGNOSIS — I10 Essential (primary) hypertension: Secondary | ICD-10-CM

## 2021-04-04 DIAGNOSIS — Z760 Encounter for issue of repeat prescription: Secondary | ICD-10-CM

## 2021-04-05 NOTE — Telephone Encounter (Signed)
Requested medication (s) are due for refill today: yes   Requested medication (s) are on the active medication list:  yes  Last refill:  02/28/2021  Future visit scheduled: no  Notes to clinic:  this refill cannot be delegated    Requested Prescriptions  Pending Prescriptions Disp Refills   Vitamin D, Ergocalciferol, (DRISDOL) 1.25 MG (50000 UNIT) CAPS capsule [Pharmacy Med Name: VITAMIN D2 50,000IU (ERGO) CAP RX] 6 capsule 0    Sig: TAKE 1 CAPSULE BY MOUTH 1 TIME A WEEK      Endocrinology:  Vitamins - Vitamin D Supplementation Failed - 04/04/2021  7:20 PM      Failed - 50,000 IU strengths are not delegated      Failed - Phosphate in normal range and within 360 days    No results found for: PHOS        Failed - Vitamin D in normal range and within 360 days    Vit D, 25-Hydroxy  Date Value Ref Range Status  08/04/2020 12.7 (L) 30.0 - 100.0 ng/mL Final    Comment:    Vitamin D deficiency has been defined by the Institute of Medicine and an Endocrine Society practice guideline as a level of serum 25-OH vitamin D less than 20 ng/mL (1,2). The Endocrine Society went on to further define vitamin D insufficiency as a level between 21 and 29 ng/mL (2). 1. IOM (Institute of Medicine). 2010. Dietary reference    intakes for calcium and D. [place]: The    [employer]. 2. Holick MF, Binkley [place], Bischoff-Ferrari HA, et al.    Evaluation, treatment, and prevention of vitamin D    deficiency: an Endocrine Society clinical practice    guideline. JCEM. 2011 Jul; 96(7):1911-30.           Passed - Ca in normal range and within 360 days    Calcium  Date Value Ref Range Status  08/04/2020 9.5 8.7 - 10.3 mg/dL Final   Calcium, Ion  Date Value Ref Range Status  06/09/2018 1.13 (L) 1.15 - 1.40 mmol/L Final          Passed - Valid encounter within last 12 months    Recent Outpatient Visits           8 months ago Essential hypertension   [place] Kerin Perna, NP   1 year ago Essential hypertension   [place], [place], NP   1 year ago Essential hypertension   [place], [place], NP   2 years ago Essential hypertension   [place], Roger David, PA-C   2 years ago Essential hypertension   [place], Roger David, PA-C

## 2021-04-05 NOTE — Telephone Encounter (Signed)
Notes to clinic:  courtesy refill given and no appointment has been scheduled    Requested Prescriptions  Pending Prescriptions Disp Refills   hydrochlorothiazide (HYDRODIURIL) 25 MG tablet [Pharmacy Med Name: HYDROCHLOROTHIAZIDE 25MG  TABLETS] 30 tablet 0    Sig: TAKE 1 TABLET(25 MG) BY MOUTH DAILY      Cardiovascular: Diuretics - Thiazide Failed - 04/04/2021  7:20 PM      Failed - Last BP in normal range    BP Readings from Last 1 Encounters:  11/21/20 140/86          Failed - Valid encounter within last 6 months    Recent Outpatient Visits           8 months ago Essential hypertension   [place], [place] P, NP   1 year ago Essential hypertension   [place], [place], NP   1 year ago Essential hypertension   [place], [place], NP   2 years ago Essential hypertension   [place], Roger David, PA-C   2 years ago Essential hypertension   [place] Clent Demark, PA-C                Passed - Ca in normal range and within 360 days    Calcium  Date Value Ref Range Status  08/04/2020 9.5 8.7 - 10.3 mg/dL Final   Calcium, Ion  Date Value Ref Range Status  06/09/2018 1.13 (L) 1.15 - 1.40 mmol/L Final          Passed - Cr in normal range and within 360 days    Creatinine, Ser  Date Value Ref Range Status  08/04/2020 0.77 0.57 - 1.00 mg/dL Final          Passed - K in normal range and within 360 days    Potassium  Date Value Ref Range Status  08/04/2020 3.5 3.5 - 5.2 mmol/L Final          Passed - Na in normal range and within 360 days    Sodium  Date Value Ref Range Status  08/04/2020 135 134 - 144 mmol/L Final            amLODipine (NORVASC) 10 MG tablet [Pharmacy Med Name: AMLODIPINE BESYLATE 10MG  TABLETS] 30 tablet 0    Sig: TAKE 1 TABLET(10 MG) BY MOUTH DAILY      Cardiovascular:   Calcium Channel Blockers Failed - 04/04/2021  7:20 PM      Failed - Last BP in normal range    BP Readings from Last 1 Encounters:  11/21/20 140/86          Failed - Valid encounter within last 6 months    Recent Outpatient Visits           8 months ago Essential hypertension   [place] Kerin Perna, NP   1 year ago Essential hypertension   [place] Kerin Perna, NP   1 year ago Essential hypertension   [place], [place], NP   2 years ago Essential hypertension   [place], Roger David, PA-C   2 years ago Essential hypertension   [place], Roger David, PA-C

## 2021-04-06 ENCOUNTER — Other Ambulatory Visit (INDEPENDENT_AMBULATORY_CARE_PROVIDER_SITE_OTHER): Payer: Self-pay | Admitting: Primary Care

## 2021-04-06 DIAGNOSIS — I10 Essential (primary) hypertension: Secondary | ICD-10-CM

## 2021-04-06 DIAGNOSIS — Z760 Encounter for issue of repeat prescription: Secondary | ICD-10-CM

## 2021-04-06 NOTE — Telephone Encounter (Signed)
Needs appt for future refills.

## 2021-04-06 NOTE — Telephone Encounter (Signed)
  Notes to clinic: Patient requesting a 90 day    Requested Prescriptions  Pending Prescriptions Disp Refills   amLODipine (NORVASC) 10 MG tablet [Pharmacy Med Name: AMLODIPINE BESYLATE 10MG  TABLETS] 90 tablet     Sig: TAKE 1 TABLET(10 MG) BY MOUTH DAILY      Cardiovascular:  Calcium Channel Blockers Failed - 04/06/2021 11:48 AM      Failed - Last BP in normal range    BP Readings from Last 1 Encounters:  11/21/20 140/86          Failed - Valid encounter within last 6 months    Recent Outpatient Visits           8 months ago Essential hypertension   [place] Kerin Perna, NP   1 year ago Essential hypertension   [place], [place] P, NP   1 year ago Essential hypertension   [place], [place], NP   2 years ago Essential hypertension   [place], Roger David, PA-C   2 years ago Essential hypertension   [place], Roger David, PA-C                  hydrochlorothiazide (HYDRODIURIL) 25 MG tablet [Pharmacy Med Name: HYDROCHLOROTHIAZIDE 25MG  TABLETS] 90 tablet     Sig: TAKE 1 TABLET(25 MG) BY MOUTH DAILY      Cardiovascular: Diuretics - Thiazide Failed - 04/06/2021 11:48 AM      Failed - Last BP in normal range    BP Readings from Last 1 Encounters:  11/21/20 140/86          Failed - Valid encounter within last 6 months    Recent Outpatient Visits           8 months ago Essential hypertension   [place] Kerin Perna, NP   1 year ago Essential hypertension   [place] Kerin Perna, NP   1 year ago Essential hypertension   [place], [place], NP   2 years ago Essential hypertension   [place], Roger David, PA-C   2 years ago Essential hypertension   [place], Roger David, PA-C                Passed - Ca in normal range and within 360 days    Calcium  Date Value Ref Range Status  08/04/2020 9.5 8.7 - 10.3 mg/dL Final   Calcium, Ion  Date Value Ref Range Status  06/09/2018 1.13 (L) 1.15 - 1.40 mmol/L Final          Passed - Cr in normal range and within 360 days    Creatinine, Ser  Date Value Ref Range Status  08/04/2020 0.77 0.57 - 1.00 mg/dL Final          Passed - K in normal range and within 360 days    Potassium  Date Value Ref Range Status  08/04/2020 3.5 3.5 - 5.2 mmol/L Final          Passed - Na in normal range and within 360 days    Sodium  Date Value Ref Range Status  08/04/2020 135 134 - 144 mmol/L Final

## 2021-05-06 ENCOUNTER — Other Ambulatory Visit (INDEPENDENT_AMBULATORY_CARE_PROVIDER_SITE_OTHER): Payer: Self-pay | Admitting: Primary Care

## 2021-05-06 DIAGNOSIS — I10 Essential (primary) hypertension: Secondary | ICD-10-CM

## 2021-05-06 DIAGNOSIS — Z760 Encounter for issue of repeat prescription: Secondary | ICD-10-CM

## 2021-05-06 NOTE — Telephone Encounter (Signed)
Requested Prescriptions  Pending Prescriptions Disp Refills  . hydrochlorothiazide (HYDRODIURIL) 25 MG tablet [Pharmacy Med Name: HYDROCHLOROTHIAZIDE '25MG'$  TABLETS] 18 tablet 0    Sig: TAKE 1 TABLET(25 MG) BY MOUTH DAILY     Cardiovascular: Diuretics - Thiazide Failed - 05/06/2021  4:36 PM      Failed - Last BP in normal range    BP Readings from Last 1 Encounters:  11/21/20 140/86         Failed - Valid encounter within last 6 months    Recent Outpatient Visits          9 months ago Essential hypertension   [place] Kerin Perna, NP   1 year ago Essential hypertension   [place], [place] P, NP   1 year ago Essential hypertension   [place], [place], NP   2 years ago Essential hypertension   [place], Roger David, PA-C   2 years ago Essential hypertension   [place] Clent [place], PA-C      Future Appointments            In 2 weeks Kerin Perna, NP [place] in normal range and within 360 days    Calcium  Date Value Ref Range Status  08/04/2020 9.5 8.7 - 10.3 mg/dL Final   Calcium, Ion  Date Value Ref Range Status  06/09/2018 1.13 (L) 1.15 - 1.40 mmol/L Final         Passed - Cr in normal range and within 360 days    Creatinine, Ser  Date Value Ref Range Status  08/04/2020 0.77 0.57 - 1.00 mg/dL Final         Passed - K in normal range and within 360 days    Potassium  Date Value Ref Range Status  08/04/2020 3.5 3.5 - 5.2 mmol/L Final         Passed - Na in normal range and within 360 days    Sodium  Date Value Ref Range Status  08/04/2020 135 134 - 144 mmol/L Final         . amLODipine (NORVASC) 10 MG tablet [Pharmacy Med Name: AMLODIPINE BESYLATE '10MG'$  TABLETS] 90 tablet     Sig: TAKE 1 TABLET(10 MG) BY MOUTH DAILY     Cardiovascular:   Calcium Channel Blockers Failed - 05/06/2021  4:36 PM      Failed - Last BP in normal range    BP Readings from Last 1 Encounters:  11/21/20 140/86         Failed - Valid encounter within last 6 months    Recent Outpatient Visits          9 months ago Essential hypertension   [place] Kerin Perna, NP   1 year ago Essential hypertension   [place], [place], NP   1 year ago Essential hypertension   [place], [place], NP   2 years ago Essential hypertension   [place], Roger David, PA-C   2 years ago Essential hypertension   [place], Roger David, PA-C      Future Appointments            In  2 weeks Kerin Perna, NP [place]

## 2021-05-24 ENCOUNTER — Encounter (INDEPENDENT_AMBULATORY_CARE_PROVIDER_SITE_OTHER): Payer: Self-pay | Admitting: Nurse Practitioner

## 2021-05-24 ENCOUNTER — Ambulatory Visit (INDEPENDENT_AMBULATORY_CARE_PROVIDER_SITE_OTHER): Payer: 59 | Admitting: Nurse Practitioner

## 2021-05-24 ENCOUNTER — Other Ambulatory Visit: Payer: Self-pay

## 2021-05-24 VITALS — BP 145/92 | HR 105 | Temp 97.7°F | Ht 64.0 in | Wt 186.4 lb

## 2021-05-24 DIAGNOSIS — I10 Essential (primary) hypertension: Secondary | ICD-10-CM | POA: Insufficient documentation

## 2021-05-24 NOTE — Progress Notes (Signed)
Subjective:    Patient here for follow-up of elevated blood pressure.  She is not exercising and is adherent to a low-salt diet.  Blood pressure is well controlled at home. Cardiac symptoms: none. Patient denies: chest pain, chest pressure/discomfort, dyspnea, irregular heart beat, and lower extremity edema. Cardiovascular risk factors: hypertension, obesity (BMI >= 30 kg/m2), and smoking/ tobacco exposure. Use of agents associated with hypertension: none. History of target organ damage: none.    Review of Systems Review of Systems  Constitutional: Negative.   HENT: Negative.    Eyes: Negative.   Respiratory: Negative.    Cardiovascular: Negative.   Gastrointestinal: Negative.   Genitourinary: Negative.   Musculoskeletal: Negative.   Skin: Negative.   Neurological: Negative.   Endo/Heme/Allergies: Negative.   Psychiatric/Behavioral: Negative.        Objective:    Physical Exam Constitutional:      General: She is not in acute distress. Cardiovascular:     Rate and Rhythm: Normal rate and regular rhythm.  Pulmonary:     Effort: Pulmonary effort is normal.     Breath sounds: Normal breath sounds.  Skin:    General: Skin is warm and dry.  Neurological:     Mental Status: She is alert and oriented to person, place, and time.  Psychiatric:        Mood and Affect: Affect normal.      Assessment:    Hypertension   Plan:    Medication: no change. Dietary sodium restriction. Regular aerobic exercise. Check blood pressures weekly and record. Follow up: 3 months and as needed.   Patient Instructions  Hypertension:  Continue current medications  Stay active - may start walking routine  DASH diet  Stay well hydrated  Follow up:  Follow up in 3 months with Juluis Mire for CPE with fasting labs   Hypertension, Adult Hypertension is another name for high blood pressure. High blood pressure forces your heart to work harder to pump blood. This can cause  problems overtime. There are two numbers in a blood pressure reading. There is a top number (systolic) over a bottom number (diastolic). It is best to have a blood pressure that is below 120/80. Healthy choicescan help lower your blood pressure, or you may need medicine to help lower it. What are the causes? The cause of this condition is not known. Some conditions may be related tohigh blood pressure. What increases the risk? Smoking. Having type 2 diabetes mellitus, high cholesterol, or both. Not getting enough exercise or physical activity. Being overweight. Having too much fat, sugar, calories, or salt (sodium) in your diet. Drinking too much alcohol. Having long-term (chronic) kidney disease. Having a family history of high blood pressure. Age. Risk increases with age. Race. You may be at higher risk if you are African American. Gender. Men are at higher risk than women before age 32. After age 53, women are at higher risk than men. Having obstructive sleep apnea. Stress. What are the signs or symptoms? High blood pressure may not cause symptoms. Very high blood pressure (hypertensive crisis) may cause: Headache. Feelings of worry or nervousness (anxiety). Shortness of breath. Nosebleed. A feeling of being sick to your stomach (nausea). Throwing up (vomiting). Changes in how you see. Very bad chest pain. Seizures. How is this treated? This condition is treated by making healthy lifestyle changes, such as: Eating healthy foods. Exercising more. Drinking less alcohol. Your health care provider may prescribe medicine if lifestyle changes are not enough to get  your blood pressure under control, and if: Your top number is above 130. Your bottom number is above 80. Your personal target blood pressure may vary. Follow these instructions at home: Eating and drinking  If told, follow the DASH eating plan. To follow this plan: Fill one half of your plate at each meal with fruits  and vegetables. Fill one fourth of your plate at each meal with whole grains. Whole grains include whole-wheat pasta, brown rice, and whole-grain bread. Eat or drink low-fat dairy products, such as skim milk or low-fat yogurt. Fill one fourth of your plate at each meal with low-fat (lean) proteins. Low-fat proteins include fish, chicken without skin, eggs, beans, and tofu. Avoid fatty meat, cured and processed meat, or chicken with skin. Avoid pre-made or processed food. Eat less than 1,500 mg of salt each day. Do not drink alcohol if: Your doctor tells you not to drink. You are pregnant, may be pregnant, or are planning to become pregnant. If you drink alcohol: Limit how much you use to: 0-1 drink a day for women. 0-2 drinks a day for men. Be aware of how much alcohol is in your drink. In the [place] one drink equals one 12 oz bottle of beer (355 mL), one 5 oz glass of wine (148 mL), or one 1 oz glass of hard liquor (44 mL).  Lifestyle  Work with your doctor to stay at a healthy weight or to lose weight. Ask your doctor what the best weight is for you. Get at least 30 minutes of exercise most days of the week. This may include walking, swimming, or biking. Get at least 30 minutes of exercise that strengthens your muscles (resistance exercise) at least 3 days a week. This may include lifting weights or doing Pilates. Do not use any products that contain nicotine or tobacco, such as cigarettes, e-cigarettes, and chewing tobacco. If you need help quitting, ask your doctor. Check your blood pressure at home as told by your doctor. Keep all follow-up visits as told by your doctor. This is important.  Medicines Take over-the-counter and prescription medicines only as told by your doctor. Follow directions carefully. Do not skip doses of blood pressure medicine. The medicine does not work as well if you skip doses. Skipping doses also puts you at risk for problems. Ask your doctor about side  effects or reactions to medicines that you should watch for. Contact a doctor if you: Think you are having a reaction to the medicine you are taking. Have headaches that keep coming back (recurring). Feel dizzy. Have swelling in your ankles. Have trouble with your vision. Get help right away if you: Get a very bad headache. Start to feel mixed up (confused). Feel weak or numb. Feel faint. Have very bad pain in your: Chest. Belly (abdomen). Throw up more than once. Have trouble breathing. Summary Hypertension is another name for high blood pressure. High blood pressure forces your heart to work harder to pump blood. For most people, a normal blood pressure is less than 120/80. Making healthy choices can help lower blood pressure. If your blood pressure does not get lower with healthy choices, you may need to take medicine. This information is not intended to replace advice given to you by your health care provider. Make sure you discuss any questions you have with your healthcare provider. Document Revised: 06/03/2018 Document Reviewed: 06/03/2018 Elsevier Patient Education  2022 [place] Your Hypertension Hypertension, also called high blood pressure, is when the  force of the blood pressing against the walls of the arteries is too strong. Arteries are blood vessels that carry blood from your heart throughout your body. Hypertension forces the heart to work harder to pump blood and may cause the arteries tobecome narrow or stiff. Understanding blood pressure readings Your personal target blood pressure may vary depending on your medical conditions, your age, and other factors. A blood pressure reading includes a higher number over a lower number. Ideally, your blood pressure should be below 120/80. You should know that: The first, or top, number is called the systolic pressure. It is a measure of the pressure in your arteries as your heart beats. The second, or bottom  number, is called the diastolic pressure. It is a measure of the pressure in your arteries as the heart relaxes. Blood pressure is classified into four stages. Based on your blood pressure reading, your health care provider may use the following stages to determine what type of treatment you need, if any. Systolic pressure and diastolicpressure are measured in a unit called mmHg. Normal Systolic pressure: below 123456. Diastolic pressure: below 80. Elevated Systolic pressure: [id number]. Diastolic pressure: below 80. Hypertension stage 1 Systolic pressure: 0000000. Diastolic pressure: [id number]. Hypertension stage 2 Systolic pressure: [id number] or above. Diastolic pressure: 90 or above. How can this condition affect me? Managing your hypertension is an important responsibility. Over time, hypertension can damage the arteries and decrease blood flow to important parts of the body, including the brain, heart, and kidneys. Having untreated or uncontrolled hypertension can lead to: A heart attack. A stroke. A weakened blood vessel (aneurysm). Heart failure. Kidney damage. Eye damage. Metabolic syndrome. Memory and concentration problems. Vascular dementia. What actions can I take to manage this condition? Hypertension can be managed by making lifestyle changes and possibly by taking medicines. Your health care provider will help you make a plan to bring yourblood pressure within a normal range. Nutrition  Eat a diet that is high in fiber and potassium, and low in salt (sodium), added sugar, and fat. An example eating plan is called the Dietary Approaches to Stop Hypertension (DASH) diet. To eat this way: Eat plenty of fresh fruits and vegetables. Try to fill one-half of your plate at each meal with fruits and vegetables. Eat whole grains, such as whole-wheat pasta, brown rice, or whole-grain bread. Fill about one-fourth of your plate with whole grains. Eat low-fat dairy products. Avoid fatty cuts of  meat, processed or cured meats, and poultry with skin. Fill about one-fourth of your plate with lean proteins such as fish, chicken without skin, beans, eggs, and tofu. Avoid pre-made and processed foods. These tend to be higher in sodium, added sugar, and fat. Reduce your daily sodium intake. Most people with hypertension should eat less than 1,500 mg of sodium a day.  Lifestyle  Work with your health care provider to maintain a healthy body weight or to lose weight. Ask what an ideal weight is for you. Get at least 30 minutes of exercise that causes your heart to beat faster (aerobic exercise) most days of the week. Activities may include walking, swimming, or biking. Include exercise to strengthen your muscles (resistance exercise), such as weight lifting, as part of your weekly exercise routine. Try to do these types of exercises for 30 minutes at least 3 days a week. Do not use any products that contain nicotine or tobacco, such as cigarettes, e-cigarettes, and chewing tobacco. If you need help quitting, ask your health care provider.  Control any long-term (chronic) conditions you have, such as high cholesterol or diabetes. Identify your sources of stress and find ways to manage stress. This may include meditation, deep breathing, or making time for fun activities.  Alcohol use Do not drink alcohol if: Your health care provider tells you not to drink. You are pregnant, may be pregnant, or are planning to become pregnant. If you drink alcohol: Limit how much you use to: 0-1 drink a day for women. 0-2 drinks a day for men. Be aware of how much alcohol is in your drink. In the [place] one drink equals one 12 oz bottle of beer (355 mL), one 5 oz glass of wine (148 mL), or one 1 oz glass of hard liquor (44 mL). Medicines Your health care provider may prescribe medicine if lifestyle changes are not enough to get your blood pressure under control and if: Your systolic blood pressure is 130 or  higher. Your diastolic blood pressure is 80 or higher. Take medicines only as told by your health care provider. Follow the directions carefully. Blood pressure medicines must be taken as told by your health care provider. The medicine does not work as well when you skip doses. Skippingdoses also puts you at risk for problems. Monitoring Before you monitor your blood pressure: Do not smoke, drink caffeinated beverages, or exercise within 30 minutes before taking a measurement. Use the bathroom and empty your bladder (urinate). Sit quietly for at least 5 minutes before taking measurements. Monitor your blood pressure at home as told by your health care provider. To do this: Sit with your back straight and supported. Place your feet flat on the floor. Do not cross your legs. Support your arm on a flat surface, such as a table. Make sure your upper arm is at heart level. Each time you measure, take two or three readings one minute apart and record the results. You may also need to have your blood pressure checked regularly by your healthcare provider. General information Talk with your health care provider about your diet, exercise habits, and other lifestyle factors that may be contributing to hypertension. Review all the medicines you take with your health care provider because there may be side effects or interactions. Keep all visits as told by your health care provider. Your health care provider can help you create and adjust your plan for managing your high blood pressure. Where to find more information National Heart, Lung, and Blood Institute: https://wilson-eaton.com/ American Heart Association: www.heart.org Contact a health care provider if: You think you are having a reaction to medicines you have taken. You have repeated (recurrent) headaches. You feel dizzy. You have swelling in your ankles. You have trouble with your vision. Get help right away if: You develop a severe headache or  confusion. You have unusual weakness or numbness, or you feel faint. You have severe pain in your chest or abdomen. You vomit repeatedly. You have trouble breathing. These symptoms may represent a serious problem that is an emergency. Do not wait to see if the symptoms will go away. Get medical help right away. Call your local emergency services (911 in the [place] Do not drive yourself to the hospital. Summary Hypertension is when the force of blood pumping through your arteries is too strong. If this condition is not controlled, it may put you at risk for serious complications. Your personal target blood pressure may vary depending on your medical conditions, your age, and other factors. For most people, a normal blood  pressure is less than 120/80. Hypertension is managed by lifestyle changes, medicines, or both. Lifestyle changes to help manage hypertension include losing weight, eating a healthy, low-sodium diet, exercising more, stopping smoking, and limiting alcohol. This information is not intended to replace advice given to you by your health care provider. Make sure you discuss any questions you have with your healthcare provider. Document Revised: 10/29/2019 Document Reviewed: 08/24/2019 Elsevier Patient Education  [place].  [email or web address]">  DASH Eating Plan DASH stands for Dietary Approaches to Stop Hypertension. The DASH eating plan is a healthy eating plan that has been shown to: Reduce high blood pressure (hypertension). Reduce your risk for type 2 diabetes, heart disease, and stroke. Help with weight loss. What are tips for following this plan? Reading food labels Check food labels for the amount of salt (sodium) per serving. Choose foods with less than 5 percent of the Daily Value of sodium. Generally, foods with less than 300 milligrams (mg) of sodium per serving fit into this eating plan. To find whole grains, look  for the word "whole" as the first word in the ingredient list. Shopping Buy products labeled as "low-sodium" or "no salt added." Buy fresh foods. Avoid canned foods and pre-made or frozen meals. Cooking Avoid adding salt when cooking. Use salt-free seasonings or herbs instead of table salt or sea salt. Check with your health care provider or pharmacist before using salt substitutes. Do not fry foods. Cook foods using healthy methods such as baking, boiling, grilling, roasting, and broiling instead. Cook with heart-healthy oils, such as olive, canola, avocado, soybean, or sunflower oil. Meal planning  Eat a balanced diet that includes: 4 or more servings of fruits and 4 or more servings of vegetables each day. Try to fill one-half of your plate with fruits and vegetables. 6-8 servings of whole grains each day. Less than 6 oz (170 g) of lean meat, poultry, or fish each day. A 3-oz (85-g) serving of meat is about the same size as a deck of cards. One egg equals 1 oz (28 g). 2-3 servings of low-fat dairy each day. One serving is 1 cup (237 mL). 1 serving of nuts, seeds, or beans 5 times each week. 2-3 servings of heart-healthy fats. Healthy fats called omega-3 fatty acids are found in foods such as walnuts, flaxseeds, fortified milks, and eggs. These fats are also found in cold-water fish, such as sardines, salmon, and mackerel. Limit how much you eat of: Canned or prepackaged foods. Food that is high in trans fat, such as some fried foods. Food that is high in saturated fat, such as fatty meat. Desserts and other sweets, sugary drinks, and other foods with added sugar. Full-fat dairy products. Do not salt foods before eating. Do not eat more than 4 egg yolks a week. Try to eat at least 2 vegetarian meals a week. Eat more home-cooked food and less restaurant, buffet, and fast food.  Lifestyle When eating at a restaurant, ask that your food be prepared with less salt or no salt, if  possible. If you drink alcohol: Limit how much you use to: 0-1 drink a day for women who are not pregnant. 0-2 drinks a day for men. Be aware of how much alcohol is in your drink. In the [place] one drink equals one 12 oz bottle of beer (355 mL), one 5 oz glass of wine (148 mL), or one 1 oz glass of hard liquor (44 mL). General information Avoid eating more than 2,300 mg of  salt a day. If you have hypertension, you may need to reduce your sodium intake to 1,500 mg a day. Work with your health care provider to maintain a healthy body weight or to lose weight. Ask what an ideal weight is for you. Get at least 30 minutes of exercise that causes your heart to beat faster (aerobic exercise) most days of the week. Activities may include walking, swimming, or biking. Work with your health care provider or dietitian to adjust your eating plan to your individual calorie needs. What foods should I eat? Fruits All fresh, dried, or frozen fruit. Canned fruit in natural juice (without addedsugar). Vegetables Fresh or frozen vegetables (raw, steamed, roasted, or grilled). Low-sodium or reduced-sodium tomato and vegetable juice. Low-sodium or reduced-sodium tomatosauce and tomato paste. Low-sodium or reduced-sodium canned vegetables. Grains Whole-grain or whole-wheat bread. Whole-grain or whole-wheat pasta. Brown rice. [place]. Bulgur. Whole-grain and low-sodium cereals. Pita bread.Low-fat, low-sodium crackers. Whole-wheat flour tortillas. Meats and other proteins Skinless chicken or [place]. Ground chicken or [place]. Pork with fat trimmed off. Fish and seafood. Egg whites. Dried beans, peas, or lentils. Unsalted nuts, nut butters, and seeds. Unsalted canned beans. Lean cuts of beef with fat trimmed off. Low-sodium, lean precooked or cured meat, such as sausages or meatloaves. Dairy Low-fat (1%) or fat-free (skim) milk. Reduced-fat, low-fat, or fat-free cheeses. Nonfat, low-sodium ricotta or cottage  cheese. Low-fat or nonfatyogurt. Low-fat, low-sodium cheese. Fats and oils Soft margarine without trans fats. Vegetable oil. Reduced-fat, low-fat, or light mayonnaise and salad dressings (reduced-sodium). Canola, safflower, olive, avocado, soybean, andsunflower oils. Avocado. Seasonings and condiments Herbs. Spices. Seasoning mixes without salt. Other foods Unsalted popcorn and pretzels. Fat-free sweets. The items listed above may not be a complete list of foods and beverages you can eat. Contact a dietitian for more information. What foods should I avoid? Fruits Canned fruit in a light or heavy syrup. Fried fruit. Fruit in cream or buttersauce. Vegetables Creamed or fried vegetables. Vegetables in a cheese sauce. Regular canned vegetables (not low-sodium or reduced-sodium). Regular canned tomato sauce and paste (not low-sodium or reduced-sodium). Regular tomato and vegetable juice(not low-sodium or reduced-sodium). Angie Fava. Olives. Grains Baked goods made with fat, such as croissants, muffins, or some breads. Drypasta or rice meal packs. Meats and other proteins Fatty cuts of meat. Ribs. Fried meat. Berniece Salines. Bologna, salami, and other precooked or cured meats, such as sausages or meat loaves. Fat from the back of a pig (fatback). Bratwurst. Salted nuts and seeds. Canned beans with added salt. Canned orsmoked fish. Whole eggs or egg yolks. Chicken or [place] with skin. Dairy Whole or 2% milk, cream, and half-and-half. Whole or full-fat cream cheese. Whole-fat or sweetened yogurt. Full-fat cheese. Nondairy creamers. Whippedtoppings. Processed cheese and cheese spreads. Fats and oils Butter. Stick margarine. Lard. Shortening. Ghee. Bacon fat. Tropical oils, suchas coconut, palm kernel, or palm oil. Seasonings and condiments Onion salt, garlic salt, seasoned salt, table salt, and sea salt. Worcestershire sauce. Tartar sauce. Barbecue sauce. Teriyaki sauce. Soy sauce, including reduced-sodium. Steak  sauce. Canned and packaged gravies. Fish sauce. Oyster sauce. Cocktail sauce. Store-bought horseradish. Ketchup. Mustard. Meat flavorings and tenderizers. Bouillon cubes. Hot sauces. Pre-made or packaged marinades. Pre-made or packaged taco seasonings. Relishes. Regular saladdressings. Other foods Salted popcorn and pretzels. The items listed above may not be a complete list of foods and beverages you should avoid. Contact a dietitian for more information. Where to find more information National Heart, Lung, and Blood Institute: https://wilson-eaton.com/ American Heart Association: www.heart.org Academy of  Nutrition and Dietetics: www.eatright.[place]: www.kidney.org Summary The DASH eating plan is a healthy eating plan that has been shown to reduce high blood pressure (hypertension). It may also reduce your risk for type 2 diabetes, heart disease, and stroke. When on the DASH eating plan, aim to eat more fresh fruits and vegetables, whole grains, lean proteins, low-fat dairy, and heart-healthy fats. With the DASH eating plan, you should limit salt (sodium) intake to 2,300 mg a day. If you have hypertension, you may need to reduce your sodium intake to 1,500 mg a day. Work with your health care provider or dietitian to adjust your eating plan to your individual calorie needs. This information is not intended to replace advice given to you by your health care provider. Make sure you discuss any questions you have with your healthcare provider. Document Revised: 08/27/2019 Document Reviewed: 08/27/2019 Elsevier Patient Education  2022 [place]. Nils Pyle, FNP-C  05/24/21

## 2021-05-24 NOTE — Patient Instructions (Addendum)
Hypertension:  Continue current medications  Stay active - may start walking routine  DASH diet  Stay well hydrated  Follow up:  Follow up in 3 months with Juluis Mire for CPE with fasting labs   Hypertension, Adult Hypertension is another name for high blood pressure. High blood pressure forces your heart to work harder to pump blood. This can cause problems overtime. There are two numbers in a blood pressure reading. There is a top number (systolic) over a bottom number (diastolic). It is best to have a blood pressure that is below 120/80. Healthy choicescan help lower your blood pressure, or you may need medicine to help lower it. What are the causes? The cause of this condition is not known. Some conditions may be related tohigh blood pressure. What increases the risk? Smoking. Having type 2 diabetes mellitus, high cholesterol, or both. Not getting enough exercise or physical activity. Being overweight. Having too much fat, sugar, calories, or salt (sodium) in your diet. Drinking too much alcohol. Having long-term (chronic) kidney disease. Having a family history of high blood pressure. Age. Risk increases with age. Race. You may be at higher risk if you are African American. Gender. Men are at higher risk than women before age 30. After age 98, women are at higher risk than men. Having obstructive sleep apnea. Stress. What are the signs or symptoms? High blood pressure may not cause symptoms. Very high blood pressure (hypertensive crisis) may cause: Headache. Feelings of worry or nervousness (anxiety). Shortness of breath. Nosebleed. A feeling of being sick to your stomach (nausea). Throwing up (vomiting). Changes in how you see. Very bad chest pain. Seizures. How is this treated? This condition is treated by making healthy lifestyle changes, such as: Eating healthy foods. Exercising more. Drinking less alcohol. Your health care provider may prescribe  medicine if lifestyle changes are not enough to get your blood pressure under control, and if: Your top number is above 130. Your bottom number is above 80. Your personal target blood pressure may vary. Follow these instructions at home: Eating and drinking  If told, follow the DASH eating plan. To follow this plan: Fill one half of your plate at each meal with fruits and vegetables. Fill one fourth of your plate at each meal with whole grains. Whole grains include whole-wheat pasta, brown rice, and whole-grain bread. Eat or drink low-fat dairy products, such as skim milk or low-fat yogurt. Fill one fourth of your plate at each meal with low-fat (lean) proteins. Low-fat proteins include fish, chicken without skin, eggs, beans, and tofu. Avoid fatty meat, cured and processed meat, or chicken with skin. Avoid pre-made or processed food. Eat less than 1,500 mg of salt each day. Do not drink alcohol if: Your doctor tells you not to drink. You are pregnant, may be pregnant, or are planning to become pregnant. If you drink alcohol: Limit how much you use to: 0-1 drink a day for women. 0-2 drinks a day for men. Be aware of how much alcohol is in your drink. In the [place] one drink equals one 12 oz bottle of beer (355 mL), one 5 oz glass of wine (148 mL), or one 1 oz glass of hard liquor (44 mL).  Lifestyle  Work with your doctor to stay at a healthy weight or to lose weight. Ask your doctor what the best weight is for you. Get at least 30 minutes of exercise most days of the week. This may include walking, swimming, or biking. Get  at least 30 minutes of exercise that strengthens your muscles (resistance exercise) at least 3 days a week. This may include lifting weights or doing Pilates. Do not use any products that contain nicotine or tobacco, such as cigarettes, e-cigarettes, and chewing tobacco. If you need help quitting, ask your doctor. Check your blood pressure at home as told by your  doctor. Keep all follow-up visits as told by your doctor. This is important.  Medicines Take over-the-counter and prescription medicines only as told by your doctor. Follow directions carefully. Do not skip doses of blood pressure medicine. The medicine does not work as well if you skip doses. Skipping doses also puts you at risk for problems. Ask your doctor about side effects or reactions to medicines that you should watch for. Contact a doctor if you: Think you are having a reaction to the medicine you are taking. Have headaches that keep coming back (recurring). Feel dizzy. Have swelling in your ankles. Have trouble with your vision. Get help right away if you: Get a very bad headache. Start to feel mixed up (confused). Feel weak or numb. Feel faint. Have very bad pain in your: Chest. Belly (abdomen). Throw up more than once. Have trouble breathing. Summary Hypertension is another name for high blood pressure. High blood pressure forces your heart to work harder to pump blood. For most people, a normal blood pressure is less than 120/80. Making healthy choices can help lower blood pressure. If your blood pressure does not get lower with healthy choices, you may need to take medicine. This information is not intended to replace advice given to you by your health care provider. Make sure you discuss any questions you have with your healthcare provider. Document Revised: 06/03/2018 Document Reviewed: 06/03/2018 Elsevier Patient Education  2022 [place] Your Hypertension Hypertension, also called high blood pressure, is when the force of the blood pressing against the walls of the arteries is too strong. Arteries are blood vessels that carry blood from your heart throughout your body. Hypertension forces the heart to work harder to pump blood and may cause the arteries tobecome narrow or stiff. Understanding blood pressure readings Your personal target blood  pressure may vary depending on your medical conditions, your age, and other factors. A blood pressure reading includes a higher number over a lower number. Ideally, your blood pressure should be below 120/80. You should know that: The first, or top, number is called the systolic pressure. It is a measure of the pressure in your arteries as your heart beats. The second, or bottom number, is called the diastolic pressure. It is a measure of the pressure in your arteries as the heart relaxes. Blood pressure is classified into four stages. Based on your blood pressure reading, your health care provider may use the following stages to determine what type of treatment you need, if any. Systolic pressure and diastolicpressure are measured in a unit called mmHg. Normal Systolic pressure: below 123456. Diastolic pressure: below 80. Elevated Systolic pressure: [id number]. Diastolic pressure: below 80. Hypertension stage 1 Systolic pressure: 0000000. Diastolic pressure: [id number]. Hypertension stage 2 Systolic pressure: [id number] or above. Diastolic pressure: 90 or above. How can this condition affect me? Managing your hypertension is an important responsibility. Over time, hypertension can damage the arteries and decrease blood flow to important parts of the body, including the brain, heart, and kidneys. Having untreated or uncontrolled hypertension can lead to: A heart attack. A stroke. A weakened blood vessel (aneurysm). Heart failure. Kidney  damage. Eye damage. Metabolic syndrome. Memory and concentration problems. Vascular dementia. What actions can I take to manage this condition? Hypertension can be managed by making lifestyle changes and possibly by taking medicines. Your health care provider will help you make a plan to bring yourblood pressure within a normal range. Nutrition  Eat a diet that is high in fiber and potassium, and low in salt (sodium), added sugar, and fat. An example eating plan is  called the Dietary Approaches to Stop Hypertension (DASH) diet. To eat this way: Eat plenty of fresh fruits and vegetables. Try to fill one-half of your plate at each meal with fruits and vegetables. Eat whole grains, such as whole-wheat pasta, brown rice, or whole-grain bread. Fill about one-fourth of your plate with whole grains. Eat low-fat dairy products. Avoid fatty cuts of meat, processed or cured meats, and poultry with skin. Fill about one-fourth of your plate with lean proteins such as fish, chicken without skin, beans, eggs, and tofu. Avoid pre-made and processed foods. These tend to be higher in sodium, added sugar, and fat. Reduce your daily sodium intake. Most people with hypertension should eat less than 1,500 mg of sodium a day.  Lifestyle  Work with your health care provider to maintain a healthy body weight or to lose weight. Ask what an ideal weight is for you. Get at least 30 minutes of exercise that causes your heart to beat faster (aerobic exercise) most days of the week. Activities may include walking, swimming, or biking. Include exercise to strengthen your muscles (resistance exercise), such as weight lifting, as part of your weekly exercise routine. Try to do these types of exercises for 30 minutes at least 3 days a week. Do not use any products that contain nicotine or tobacco, such as cigarettes, e-cigarettes, and chewing tobacco. If you need help quitting, ask your health care provider. Control any long-term (chronic) conditions you have, such as high cholesterol or diabetes. Identify your sources of stress and find ways to manage stress. This may include meditation, deep breathing, or making time for fun activities.  Alcohol use Do not drink alcohol if: Your health care provider tells you not to drink. You are pregnant, may be pregnant, or are planning to become pregnant. If you drink alcohol: Limit how much you use to: 0-1 drink a day for women. 0-2 drinks a day  for men. Be aware of how much alcohol is in your drink. In the [place] one drink equals one 12 oz bottle of beer (355 mL), one 5 oz glass of wine (148 mL), or one 1 oz glass of hard liquor (44 mL). Medicines Your health care provider may prescribe medicine if lifestyle changes are not enough to get your blood pressure under control and if: Your systolic blood pressure is 130 or higher. Your diastolic blood pressure is 80 or higher. Take medicines only as told by your health care provider. Follow the directions carefully. Blood pressure medicines must be taken as told by your health care provider. The medicine does not work as well when you skip doses. Skippingdoses also puts you at risk for problems. Monitoring Before you monitor your blood pressure: Do not smoke, drink caffeinated beverages, or exercise within 30 minutes before taking a measurement. Use the bathroom and empty your bladder (urinate). Sit quietly for at least 5 minutes before taking measurements. Monitor your blood pressure at home as told by your health care provider. To do this: Sit with your back straight and supported. Place  your feet flat on the floor. Do not cross your legs. Support your arm on a flat surface, such as a table. Make sure your upper arm is at heart level. Each time you measure, take two or three readings one minute apart and record the results. You may also need to have your blood pressure checked regularly by your healthcare provider. General information Talk with your health care provider about your diet, exercise habits, and other lifestyle factors that may be contributing to hypertension. Review all the medicines you take with your health care provider because there may be side effects or interactions. Keep all visits as told by your health care provider. Your health care provider can help you create and adjust your plan for managing your high blood pressure. Where to find more information National  Heart, Lung, and Blood Institute: https://wilson-eaton.com/ American Heart Association: www.heart.org Contact a health care provider if: You think you are having a reaction to medicines you have taken. You have repeated (recurrent) headaches. You feel dizzy. You have swelling in your ankles. You have trouble with your vision. Get help right away if: You develop a severe headache or confusion. You have unusual weakness or numbness, or you feel faint. You have severe pain in your chest or abdomen. You vomit repeatedly. You have trouble breathing. These symptoms may represent a serious problem that is an emergency. Do not wait to see if the symptoms will go away. Get medical help right away. Call your local emergency services (911 in the [place] Do not drive yourself to the hospital. Summary Hypertension is when the force of blood pumping through your arteries is too strong. If this condition is not controlled, it may put you at risk for serious complications. Your personal target blood pressure may vary depending on your medical conditions, your age, and other factors. For most people, a normal blood pressure is less than 120/80. Hypertension is managed by lifestyle changes, medicines, or both. Lifestyle changes to help manage hypertension include losing weight, eating a healthy, low-sodium diet, exercising more, stopping smoking, and limiting alcohol. This information is not intended to replace advice given to you by your health care provider. Make sure you discuss any questions you have with your healthcare provider. Document Revised: 10/29/2019 Document Reviewed: 08/24/2019 Elsevier Patient Education  [place].  [email or web address]">  DASH Eating Plan DASH stands for Dietary Approaches to Stop Hypertension. The DASH eating plan is a healthy eating plan that has been shown to: Reduce high blood pressure (hypertension). Reduce your risk for  type 2 diabetes, heart disease, and stroke. Help with weight loss. What are tips for following this plan? Reading food labels Check food labels for the amount of salt (sodium) per serving. Choose foods with less than 5 percent of the Daily Value of sodium. Generally, foods with less than 300 milligrams (mg) of sodium per serving fit into this eating plan. To find whole grains, look for the word "whole" as the first word in the ingredient list. Shopping Buy products labeled as "low-sodium" or "no salt added." Buy fresh foods. Avoid canned foods and pre-made or frozen meals. Cooking Avoid adding salt when cooking. Use salt-free seasonings or herbs instead of table salt or sea salt. Check with your health care provider or pharmacist before using salt substitutes. Do not fry foods. Cook foods using healthy methods such as baking, boiling, grilling, roasting, and broiling instead. Cook with heart-healthy oils, such as olive, canola, avocado, soybean, or sunflower oil. Meal planning  Eat  a balanced diet that includes: 4 or more servings of fruits and 4 or more servings of vegetables each day. Try to fill one-half of your plate with fruits and vegetables. 6-8 servings of whole grains each day. Less than 6 oz (170 g) of lean meat, poultry, or fish each day. A 3-oz (85-g) serving of meat is about the same size as a deck of cards. One egg equals 1 oz (28 g). 2-3 servings of low-fat dairy each day. One serving is 1 cup (237 mL). 1 serving of nuts, seeds, or beans 5 times each week. 2-3 servings of heart-healthy fats. Healthy fats called omega-3 fatty acids are found in foods such as walnuts, flaxseeds, fortified milks, and eggs. These fats are also found in cold-water fish, such as sardines, salmon, and mackerel. Limit how much you eat of: Canned or prepackaged foods. Food that is high in trans fat, such as some fried foods. Food that is high in saturated fat, such as fatty meat. Desserts and other  sweets, sugary drinks, and other foods with added sugar. Full-fat dairy products. Do not salt foods before eating. Do not eat more than 4 egg yolks a week. Try to eat at least 2 vegetarian meals a week. Eat more home-cooked food and less restaurant, buffet, and fast food.  Lifestyle When eating at a restaurant, ask that your food be prepared with less salt or no salt, if possible. If you drink alcohol: Limit how much you use to: 0-1 drink a day for women who are not pregnant. 0-2 drinks a day for men. Be aware of how much alcohol is in your drink. In the [place] one drink equals one 12 oz bottle of beer (355 mL), one 5 oz glass of wine (148 mL), or one 1 oz glass of hard liquor (44 mL). General information Avoid eating more than 2,300 mg of salt a day. If you have hypertension, you may need to reduce your sodium intake to 1,500 mg a day. Work with your health care provider to maintain a healthy body weight or to lose weight. Ask what an ideal weight is for you. Get at least 30 minutes of exercise that causes your heart to beat faster (aerobic exercise) most days of the week. Activities may include walking, swimming, or biking. Work with your health care provider or dietitian to adjust your eating plan to your individual calorie needs. What foods should I eat? Fruits All fresh, dried, or frozen fruit. Canned fruit in natural juice (without addedsugar). Vegetables Fresh or frozen vegetables (raw, steamed, roasted, or grilled). Low-sodium or reduced-sodium tomato and vegetable juice. Low-sodium or reduced-sodium tomatosauce and tomato paste. Low-sodium or reduced-sodium canned vegetables. Grains Whole-grain or whole-wheat bread. Whole-grain or whole-wheat pasta. Brown rice. [place]. Bulgur. Whole-grain and low-sodium cereals. Pita bread.Low-fat, low-sodium crackers. Whole-wheat flour tortillas. Meats and other proteins Skinless chicken or [place]. Ground chicken or [place]. Pork with fat  trimmed off. Fish and seafood. Egg whites. Dried beans, peas, or lentils. Unsalted nuts, nut butters, and seeds. Unsalted canned beans. Lean cuts of beef with fat trimmed off. Low-sodium, lean precooked or cured meat, such as sausages or meatloaves. Dairy Low-fat (1%) or fat-free (skim) milk. Reduced-fat, low-fat, or fat-free cheeses. Nonfat, low-sodium ricotta or cottage cheese. Low-fat or nonfatyogurt. Low-fat, low-sodium cheese. Fats and oils Soft margarine without trans fats. Vegetable oil. Reduced-fat, low-fat, or light mayonnaise and salad dressings (reduced-sodium). Canola, safflower, olive, avocado, soybean, andsunflower oils. Avocado. Seasonings and condiments Herbs. Spices. Seasoning mixes without salt.  Other foods Unsalted popcorn and pretzels. Fat-free sweets. The items listed above may not be a complete list of foods and beverages you can eat. Contact a dietitian for more information. What foods should I avoid? Fruits Canned fruit in a light or heavy syrup. Fried fruit. Fruit in cream or buttersauce. Vegetables Creamed or fried vegetables. Vegetables in a cheese sauce. Regular canned vegetables (not low-sodium or reduced-sodium). Regular canned tomato sauce and paste (not low-sodium or reduced-sodium). Regular tomato and vegetable juice(not low-sodium or reduced-sodium). Angie Fava. Olives. Grains Baked goods made with fat, such as croissants, muffins, or some breads. Drypasta or rice meal packs. Meats and other proteins Fatty cuts of meat. Ribs. Fried meat. Berniece Salines. Bologna, salami, and other precooked or cured meats, such as sausages or meat loaves. Fat from the back of a pig (fatback). Bratwurst. Salted nuts and seeds. Canned beans with added salt. Canned orsmoked fish. Whole eggs or egg yolks. Chicken or [place] with skin. Dairy Whole or 2% milk, cream, and half-and-half. Whole or full-fat cream cheese. Whole-fat or sweetened yogurt. Full-fat cheese. Nondairy creamers. Whippedtoppings.  Processed cheese and cheese spreads. Fats and oils Butter. Stick margarine. Lard. Shortening. Ghee. Bacon fat. Tropical oils, suchas coconut, palm kernel, or palm oil. Seasonings and condiments Onion salt, garlic salt, seasoned salt, table salt, and sea salt. Worcestershire sauce. Tartar sauce. Barbecue sauce. Teriyaki sauce. Soy sauce, including reduced-sodium. Steak sauce. Canned and packaged gravies. Fish sauce. Oyster sauce. Cocktail sauce. Store-bought horseradish. Ketchup. Mustard. Meat flavorings and tenderizers. Bouillon cubes. Hot sauces. Pre-made or packaged marinades. Pre-made or packaged taco seasonings. Relishes. Regular saladdressings. Other foods Salted popcorn and pretzels. The items listed above may not be a complete list of foods and beverages you should avoid. Contact a dietitian for more information. Where to find more information National Heart, Lung, and Blood Institute: https://wilson-eaton.com/ American Heart Association: www.heart.org Academy of Nutrition and Dietetics: www.eatright.[place]: www.kidney.org Summary The DASH eating plan is a healthy eating plan that has been shown to reduce high blood pressure (hypertension). It may also reduce your risk for type 2 diabetes, heart disease, and stroke. When on the DASH eating plan, aim to eat more fresh fruits and vegetables, whole grains, lean proteins, low-fat dairy, and heart-healthy fats. With the DASH eating plan, you should limit salt (sodium) intake to 2,300 mg a day. If you have hypertension, you may need to reduce your sodium intake to 1,500 mg a day. Work with your health care provider or dietitian to adjust your eating plan to your individual calorie needs. This information is not intended to replace advice given to you by your health care provider. Make sure you discuss any questions you have with your healthcare provider. Document Revised: 08/27/2019 Document Reviewed: 08/27/2019 Elsevier Patient  Education  2022 [employer].

## 2021-05-25 ENCOUNTER — Other Ambulatory Visit (INDEPENDENT_AMBULATORY_CARE_PROVIDER_SITE_OTHER): Payer: Self-pay | Admitting: Primary Care

## 2021-05-25 DIAGNOSIS — Z760 Encounter for issue of repeat prescription: Secondary | ICD-10-CM

## 2021-05-25 DIAGNOSIS — I10 Essential (primary) hypertension: Secondary | ICD-10-CM

## 2021-07-17 ENCOUNTER — Other Ambulatory Visit: Payer: Self-pay

## 2021-07-17 ENCOUNTER — Ambulatory Visit (AMBULATORY_SURGERY_CENTER): Payer: 59

## 2021-07-17 ENCOUNTER — Encounter: Payer: Self-pay | Admitting: Internal Medicine

## 2021-07-17 VITALS — Ht 64.0 in | Wt 185.0 lb

## 2021-07-17 DIAGNOSIS — Z86010 Personal history of colonic polyps: Secondary | ICD-10-CM

## 2021-07-17 NOTE — Progress Notes (Signed)

## 2021-07-30 ENCOUNTER — Telehealth: Payer: Self-pay | Admitting: Internal Medicine

## 2021-07-30 NOTE — Telephone Encounter (Signed)
Patient will need to cancel colonoscopy for tomorrow as she did not follow the recommended diet for the 5 days prior, nor did she get the Miralax for the prep.  Will reschedule to a different date.

## 2021-07-31 ENCOUNTER — Encounter: Payer: 59 | Admitting: Internal Medicine

## 2021-08-22 ENCOUNTER — Other Ambulatory Visit (INDEPENDENT_AMBULATORY_CARE_PROVIDER_SITE_OTHER): Payer: Self-pay | Admitting: Nurse Practitioner

## 2021-08-22 DIAGNOSIS — Z760 Encounter for issue of repeat prescription: Secondary | ICD-10-CM

## 2021-08-22 DIAGNOSIS — I10 Essential (primary) hypertension: Secondary | ICD-10-CM

## 2021-08-24 ENCOUNTER — Ambulatory Visit (INDEPENDENT_AMBULATORY_CARE_PROVIDER_SITE_OTHER): Payer: 59 | Admitting: Primary Care

## 2021-08-28 ENCOUNTER — Other Ambulatory Visit: Payer: Self-pay

## 2021-08-28 ENCOUNTER — Ambulatory Visit (INDEPENDENT_AMBULATORY_CARE_PROVIDER_SITE_OTHER): Payer: 59 | Admitting: Primary Care

## 2021-08-28 ENCOUNTER — Encounter (INDEPENDENT_AMBULATORY_CARE_PROVIDER_SITE_OTHER): Payer: Self-pay | Admitting: Primary Care

## 2021-08-28 VITALS — BP 145/90 | HR 91 | Temp 97.9°F | Ht 64.0 in | Wt 183.6 lb

## 2021-08-28 DIAGNOSIS — E661 Drug-induced obesity: Secondary | ICD-10-CM

## 2021-08-28 DIAGNOSIS — F17210 Nicotine dependence, cigarettes, uncomplicated: Secondary | ICD-10-CM

## 2021-08-28 DIAGNOSIS — F17200 Nicotine dependence, unspecified, uncomplicated: Secondary | ICD-10-CM

## 2021-08-28 DIAGNOSIS — I10 Essential (primary) hypertension: Secondary | ICD-10-CM

## 2021-08-28 DIAGNOSIS — E559 Vitamin D deficiency, unspecified: Secondary | ICD-10-CM | POA: Diagnosis not present

## 2021-08-28 DIAGNOSIS — Z760 Encounter for issue of repeat prescription: Secondary | ICD-10-CM

## 2021-08-28 DIAGNOSIS — Z6833 Body mass index (BMI) 33.0-33.9, adult: Secondary | ICD-10-CM

## 2021-08-28 MED ORDER — HYDROCHLOROTHIAZIDE 25 MG PO TABS: ORAL_TABLET | ORAL | 1 refills | Status: DC

## 2021-08-28 NOTE — Progress Notes (Signed)
[name]   Yvette Davis is a 60 y.o. female presents for hypertension evaluation, Denies shortness of breath, headaches, chest pain or lower extremity edema, sudden onset, vision changes, unilateral weakness, dizziness, paresthesias. She is out of amlodipine and requesting refill.   Patient reports adherence with medications.  Dietary habits include: yes Exercise habits include:yes Family / Social history: no   Past Medical History:  Diagnosis Date   Anemia    past hx    Arthritis    Endometriosis    Hx of adenomatous colonic polyps including advanced adenomas large sessile lesions 12/07/2020   Hypertension    Past Surgical History:  Procedure Laterality Date   ABDOMINAL HYSTERECTOMY     CHOLECYSTECTOMY     CHOLECYSTECTOMY     COLONOSCOPY     Allergies  Allergen Reactions   Meperidine Hcl     REACTION: delirious   Morphine     REACTION: delirious   Penicillins     REACTION: throat closes   Sulfonamide Derivatives     REACTION: itching   Current Outpatient Medications on File Prior to Visit  Medication Sig Dispense Refill   amLODipine (NORVASC) 10 MG tablet Take 1 tablet (10 mg total) by mouth daily. 90 tablet 1   No current facility-administered medications on file prior to visit.   Social History   Socioeconomic History   Marital status: Single    Spouse name: Not on file   Number of children: Not on file   Years of education: Not on file   Highest education level: Not on file  Occupational History   Not on file  Tobacco Use   Smoking status: Every Day    Packs/day: 0.25    Years: 31.00    Pack years: 7.75    Types: Cigarettes   Smokeless tobacco: Never  Vaping Use   Vaping Use: Never used  Substance and Sexual Activity   Alcohol use: Yes    Comment: Drinks beer now and then   Drug use: No   Sexual activity: Never  Other Topics Concern   Not on file  Social History Narrative   Not on file   Social Determinants of  Health   Financial Resource Strain: Not on file  Food Insecurity: Not on file  Transportation Needs: Not on file  Physical Activity: Not on file  Stress: Not on file  Social Connections: Not on file  Intimate Partner Violence: Not on file   Family History  Problem Relation Age of Onset   Stomach cancer Mother    Colon cancer Maternal Uncle    Colon polyps Neg Hx    Esophageal cancer Neg Hx    Rectal cancer Neg Hx      OBJECTIVE:  Vitals:   08/28/21 0836 08/28/21 0848  BP: (!) 156/98 (!) 145/90  Pulse: 89 91  Temp: 97.9 F (36.6 C)   TempSrc: Temporal   SpO2: 97%   Weight: 183 lb 9.6 oz (83.3 kg)   Height: _0  (1.626 m)    Physical exam: General: Vital signs reviewed.  Patient is well-developed and well-nourished, obese female in no acute distress and cooperative with exam. Head: Normocephalic and atraumatic. Eyes: EOMI, conjunctivae normal, no scleral icterus. Neck: Supple, trachea midline, normal ROM, no JVD, masses, thyromegaly, or carotid bruit present. Cardiovascular: RRR, S1 normal, S2 normal, no murmurs, gallops, or rubs. Pulmonary/Chest: Clear to auscultation bilaterally, no wheezes, rales, or rhonchi. Abdominal: Soft, non-tender, non-distended, BS +, no masses, organomegaly, or  guarding present. Musculoskeletal: No joint deformities, erythema, or stiffness, ROM full and nontender. Extremities: No lower extremity edema bilaterally,  pulses symmetric and intact bilaterally. No cyanosis or clubbing. Neurological: A&O x3, Strength is normal Skin: Warm, dry and intact. No rashes or erythema. Psychiatric: Normal mood and affect. speech and behavior is normal. Cognition and memory are normal.     ROS Comprehensive ROS Pertinent positive and negative noted in HPI Last 3 Office BP readings: BP Readings from Last 3 Encounters:  08/28/21 (!) 145/90  05/24/21 (!) 145/92  11/21/20 140/86    BMET    Component Value Date/Time   NA 135 08/04/2020 1123   K 3.5  08/04/2020 1123   CL 95 (L) 08/04/2020 1123   CO2 25 08/04/2020 1123   GLUCOSE 140 (H) 08/04/2020 1123   GLUCOSE 96 06/09/2018 1140   BUN 4 (L) 08/04/2020 1123   CREATININE 0.77 08/04/2020 1123   CALCIUM 9.5 08/04/2020 1123   GFRNONAA 84 08/04/2020 1123   GFRAA 97 08/04/2020 1123    Renal function: CrCl cannot be calculated (Patient's most recent lab result is older than the maximum 21 days allowed.).  Clinical ASCVD: Yes  The 10-year ASCVD risk score (Arnett DK, et al., 2019) is: 15%   Values used to calculate the score:     Age: 67 years     Sex: Female     Is Non-Hispanic African American: Yes     Diabetic: No     Tobacco smoker: Yes     Systolic Blood Pressure: 027 mmHg     Is BP treated: Yes     HDL Cholesterol: 44 mg/dL     Total Cholesterol: 132 mg/dL  ASCVD risk factors include- [place]   ASSESSMENT & PLAN:  Meds ordered this encounter  Medications   hydrochlorothiazide (HYDRODIURIL) 25 MG tablet    Sig: Take 1 tablet daily in AM    Dispense:  90 tablet    Refill:  1   Yvette Davis was seen today for hypertension and medication refill.  Diagnoses and all orders for this visit:  Essential hypertension -Counseled on lifestyle modifications for blood pressure control including reduced dietary sodium, increased exercise, weight reduction and adequate sleep. Also, educated patient about the risk for cardiovascular events, stroke and heart attack. Also counseled patient about the importance of medication adherence. If you participate in smoking, it is important to stop using tobacco as this will increase the risks associated with uncontrolled blood pressure.   -Hypertension longstanding diagnosed currently HCTZ 53m on current medications. Patient is adherent with current medications.   Goal BP:  For patients younger than 60: Goal BP < 130/80. For patients 60 and older: Goal BP < 140/90. For patients with diabetes: Goal BP < 130/80. Your most recent BP:  145/90  Minimize salt intake. Minimize alcohol intake   CMP14+EGFR -     CBC with Differential  Tobacco use disorder - I have recommended complete cessation of tobacco use. I have discussed various options available for assistance with tobacco cessation including over the counter methods (Nicotine gum, patch and lozenges). We also discussed prescription options (Chantix, Nicotine Inhaler / Nasal Spray). The patient is not interested in pursuing any prescription tobacco cessation options at this time. - Patient declines at this time.  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 This may lead to other co-morbidities. Lifestyle modifications of diet and exercise may reduce obesity.   -     Lipid Panel  Vitamin D deficiency Vitamin D is needed to make and keep bones strong.  -     CBC with Differential -     Vitamin D, 25-hydroxy    This note has been created with Surveyor, quantity. Any transcriptional errors are unintentional.   Kerin Perna, NP 08/28/2021, 10:19 AM

## 2021-08-28 NOTE — Patient Instructions (Signed)

## 2021-08-29 LAB — CMP14+EGFR
ALT: 20 IU/L (ref 0–32)
AST: 41 IU/L — ABNORMAL HIGH (ref 0–40)
Albumin/Globulin Ratio: 1.2 (ref 1.2–2.2)
Albumin: 4.6 g/dL (ref 3.8–4.8)
Alkaline Phosphatase: 144 IU/L — ABNORMAL HIGH (ref 44–121)
BUN/Creatinine Ratio: 8 — ABNORMAL LOW (ref 12–28)
BUN: 7 mg/dL — ABNORMAL LOW (ref 8–27)
Bilirubin Total: 0.6 mg/dL (ref 0.0–1.2)
CO2: 26 mmol/L (ref 20–29)
Calcium: 9.9 mg/dL (ref 8.7–10.3)
Chloride: 94 mmol/L — ABNORMAL LOW (ref 96–106)
Creatinine, Ser: 0.93 mg/dL (ref 0.57–1.00)
Globulin, Total: 3.9 g/dL (ref 1.5–4.5)
Glucose: 109 mg/dL — ABNORMAL HIGH (ref 70–99)
Potassium: 3.7 mmol/L (ref 3.5–5.2)
Sodium: 137 mmol/L (ref 134–144)
Total Protein: 8.5 g/dL (ref 6.0–8.5)
eGFR: 70 mL/min/{1.73_m2} (ref >59)

## 2021-08-29 LAB — CBC WITH DIFFERENTIAL/PLATELET
Basophils Absolute: 0.1 10*3/uL (ref 0.0–0.2)
Basos: 1 %
EOS (ABSOLUTE): 0.1 10*3/uL (ref 0.0–0.4)
Eos: 1 %
Hematocrit: 44.4 % (ref 34.0–46.6)
Hemoglobin: 14.3 g/dL (ref 11.1–15.9)
Immature Grans (Abs): 0 10*3/uL (ref 0.0–0.1)
Immature Granulocytes: 0 %
Lymphocytes Absolute: 1.5 10*3/uL (ref 0.7–3.1)
Lymphs: 19 %
MCH: 24.6 pg — ABNORMAL LOW (ref 26.6–33.0)
MCHC: 32.2 g/dL (ref 31.5–35.7)
MCV: 76 fL — ABNORMAL LOW (ref 79–97)
Monocytes Absolute: 0.7 10*3/uL (ref 0.1–0.9)
Monocytes: 9 %
Neutrophils Absolute: 5.8 10*3/uL (ref 1.4–7.0)
Neutrophils: 70 %
Platelets: 249 10*3/uL (ref 150–450)
RBC: 5.81 x10E6/uL — ABNORMAL HIGH (ref 3.77–5.28)
RDW: 14 % (ref 11.7–15.4)
WBC: 8.2 10*3/uL (ref 3.4–10.8)

## 2021-08-29 LAB — LIPID PANEL
Chol/HDL Ratio: 2.9 ratio (ref 0.0–4.4)
Cholesterol, Total: 151 mg/dL (ref 100–199)
HDL: 52 mg/dL (ref >39)
LDL Chol Calc (NIH): 75 mg/dL (ref 0–99)
Triglycerides: 135 mg/dL (ref 0–149)
VLDL Cholesterol Cal: 24 mg/dL (ref 5–40)

## 2021-08-29 LAB — VITAMIN D 25 HYDROXY (VIT D DEFICIENCY, FRACTURES): Vit D, 25-Hydroxy: 48.7 ng/mL (ref 30.0–100.0)

## 2021-09-03 ENCOUNTER — Telehealth (INDEPENDENT_AMBULATORY_CARE_PROVIDER_SITE_OTHER): Payer: Self-pay

## 2021-09-03 NOTE — Telephone Encounter (Signed)
-----   Message from Kerin Perna, NP sent at 09/03/2021  3:30 PM EST ----- Labs are  normal. Low values indication dehydration.  Make sure you are drinking at least 48 oz of water per day. Work on eating a low fat, heart healthy diet and participate in regular aerobic exercise program to control as well. Exercise at least  30 minutes per day-5 days per week. Avoid red meat. No fried foods. No junk foods, sodas, sugary foods or drinks, unhealthy snacking, alcohol or smoking.

## 2021-09-03 NOTE — Telephone Encounter (Signed)
Patient aware of normal labs. Yvette Davis, CMA  

## 2021-09-20 ENCOUNTER — Ambulatory Visit (AMBULATORY_SURGERY_CENTER): Payer: 59 | Admitting: Internal Medicine

## 2021-09-20 ENCOUNTER — Encounter: Payer: Self-pay | Admitting: Internal Medicine

## 2021-09-20 ENCOUNTER — Other Ambulatory Visit: Payer: Self-pay

## 2021-09-20 VITALS — BP 138/76 | HR 67 | Temp 98.9°F | Resp 18 | Ht 64.0 in | Wt 185.0 lb

## 2021-09-20 DIAGNOSIS — Z86010 Personal history of colonic polyps: Secondary | ICD-10-CM | POA: Diagnosis not present

## 2021-09-20 DIAGNOSIS — D128 Benign neoplasm of rectum: Secondary | ICD-10-CM | POA: Diagnosis not present

## 2021-09-20 DIAGNOSIS — D123 Benign neoplasm of transverse colon: Secondary | ICD-10-CM

## 2021-09-20 DIAGNOSIS — D125 Benign neoplasm of sigmoid colon: Secondary | ICD-10-CM | POA: Diagnosis not present

## 2021-09-20 DIAGNOSIS — D127 Benign neoplasm of rectosigmoid junction: Secondary | ICD-10-CM | POA: Diagnosis not present

## 2021-09-20 DIAGNOSIS — D122 Benign neoplasm of ascending colon: Secondary | ICD-10-CM

## 2021-09-20 MED ORDER — SODIUM CHLORIDE 0.9 % IV SOLN: 500.0000 mL | Freq: Once | INTRAVENOUS | Status: DC

## 2021-09-20 NOTE — Progress Notes (Signed)
[facility] History and Physical   Primary Care Physician:  Kerin Perna, NP   Reason for Procedure:   Hx colon polyp  Plan:    colonoscopy     HPI: Yvette Davis is a 60 y.o. female w/ hx of 30 mm and 35 mm adenomas 2/22.    Past Medical History:  Diagnosis Date   Anemia    past hx    Arthritis    Endometriosis    Hx of adenomatous colonic polyps including advanced adenomas large sessile lesions 12/07/2020   Hypertension     Past Surgical History:  Procedure Laterality Date   ABDOMINAL HYSTERECTOMY     CHOLECYSTECTOMY     CHOLECYSTECTOMY     COLONOSCOPY      Prior to Admission medications   Medication Sig Start Date End Date Taking? Authorizing Provider  amLODipine (NORVASC) 10 MG tablet Take 1 tablet (10 mg total) by mouth daily. 08/28/21  Yes Kerin Perna, NP  hydrochlorothiazide (HYDRODIURIL) 25 MG tablet Take 1 tablet daily in AM 08/28/21  Yes Kerin Perna, NP    Current Outpatient Medications  Medication Sig Dispense Refill   amLODipine (NORVASC) 10 MG tablet Take 1 tablet (10 mg total) by mouth daily. 90 tablet 1   hydrochlorothiazide (HYDRODIURIL) 25 MG tablet Take 1 tablet daily in AM 90 tablet 1   Current Facility-Administered Medications  Medication Dose Route Frequency Provider Last Rate Last Admin   0.9 %  sodium chloride infusion  500 mL Intravenous Once Gatha Mayer, MD        Allergies as of 09/20/2021 - Review Complete 09/20/2021  Allergen Reaction Noted   Meperidine hcl  11/22/2009   Morphine  11/22/2009   Penicillins  11/22/2009   Sulfonamide derivatives  11/22/2009    Family History  Problem Relation Age of Onset   Stomach cancer Mother    Colon cancer Maternal Uncle    Colon polyps Neg Hx    Esophageal cancer Neg Hx    Rectal cancer Neg Hx     Social History   Socioeconomic History   Marital status: Single    Spouse name: Not on file   Number of children: Not on file   Years of  education: Not on file   Highest education level: Not on file  Occupational History   Not on file  Tobacco Use   Smoking status: Every Day    Packs/day: 0.25    Years: 31.00    Pack years: 7.75    Types: Cigarettes   Smokeless tobacco: Never   Tobacco comments:    Started at age 57, quit for a short time  Vaping Use   Vaping Use: Never used  Substance and Sexual Activity   Alcohol use: Yes    Alcohol/week: 8.0 standard drinks    Types: 8 Cans of beer per week    Comment: Drinks beer on weekend   Drug use: Never    Review of Systems:  other review of systems negative except as mentioned in the HPI.  Physical Exam: Vital signs BP (!) 142/82    Pulse 88    Temp 98.9 F (37.2 C) (Temporal)    Ht 5\' 4"  (1.626 m)    Wt 185 lb (83.9 kg)    SpO2 97%    BMI 31.76 kg/m   General:   Alert,  Well-developed, well-nourished, pleasant and cooperative in NAD Lungs:  Clear throughout to auscultation.   Heart:  Regular rate  and rhythm; no murmurs, clicks, rubs,  or gallops. Abdomen:  Soft, nontender and nondistended. Normal bowel sounds.   Neuro/Psych:  Alert and cooperative. Normal mood and affect. A and O x 3   @Lua Feng  Simonne Maffucci, MD, [facility] [phone] (pager) 09/20/2021 10:52 AM@

## 2021-09-20 NOTE — Op Note (Signed)
[place] Patient Name: Yvette Davis Procedure Date: 09/20/2021 10:53 AM MRN: [id number] Endoscopist: Gatha Mayer , MD Age: 60 Referring MD:  Date of Birth: [birth year] Gender: Female Account #: [id number] Procedure:                Colonoscopy Indications:              Surveillance: Piecemeal removal of large sessile                            adenoma last colonoscopy (< 3 yrs) Medicines:                Propofol per Anesthesia, Monitored Anesthesia Care Procedure:                Pre-Anesthesia Assessment:                           - Prior to the procedure, a History and Physical                            was performed, and patient medications and                            allergies were reviewed. The patient's tolerance of                            previous anesthesia was also reviewed. The risks                            and benefits of the procedure and the sedation                            options and risks were discussed with the patient.                            All questions were answered, and informed consent                            was obtained. Prior Anticoagulants: The patient has                            taken no previous anticoagulant or antiplatelet                            agents. ASA Grade Assessment: II - A patient with                            mild systemic disease. After reviewing the risks                            and benefits, the patient was deemed in                            satisfactory condition to undergo the procedure.  After obtaining informed consent, the colonoscope                            was passed under direct vision. Throughout the                            procedure, the patient's blood pressure, pulse, and                            oxygen saturations were monitored continuously. The                            CF [id number] #[id number] was introduced through the anus                             and advanced to the the cecum, identified by                            appendiceal orifice and ileocecal valve. The                            colonoscopy was somewhat difficult due to                            positioning to remove ascending polyp. Successful                            completion of the procedure was aided by                            straightening and shortening the scope to obtain                            bowel loop reduction and use of abdominal binder.                            The patient tolerated the procedure well. The                            quality of the bowel preparation was good. The                            ileocecal valve, appendiceal orifice, and rectum                            were photographed. The bowel preparation used was                            Miralax via split dose instruction. Scope In: 11:02:53 AM Scope Out: 11:24:23 AM Scope Withdrawal Time: 0 hours 17 minutes 20 seconds  Total Procedure Duration: 0 hours 21 minutes 30 seconds  Findings:                 The perianal and digital rectal examinations were  normal.                           A 8 mm polyp was found in the ascending colon. The                            polyp was sessile. The polyp was removed with a                            piecemeal technique using a hot snare. Resection                            and retrieval were complete. Verification of                            patient identification for the specimen was done.                            Estimated blood loss: none.                           A tattoo was seen in the ascending colon.                           A few diverticula were found in the right colon.                           Four sessile polyps were found in the rectum,                            sigmoid colon and transverse colon. The polyps were                            diminutive in size. These polyps were removed with                             a cold snare. Resection was complete, but the polyp                            tissue was only partially retrieved. Verification                            of patient identification for the specimen was                            done. Estimated blood loss was minimal.                           The exam was otherwise without abnormality on                            direct and retroflexion views. Complications:            No immediate complications. Estimated Blood Loss:  Estimated blood loss was minimal. Impression:               - One 8 mm polyp in the ascending colon, removed                            piecemeal using a hot snare. Resected and                            retrieved. this was in region of prior piecemeal                            polypectomy. tucked between 2 folds and somewhat                            difficult to position and approach.                           - A tattoo was seen in the ascending colon.                           - Diverticulosis in the right colon. few                           - Four diminutive polyps in the rectum, in the                            sigmoid colon and in the transverse colon, removed                            with a cold snare. Complete resection. Partial                            retrieval. 1 of 2 rectal polyps not recovered                           - The examination was otherwise normal on direct                            and retroflexion views.                           - Personal history of colonic polyps. Multiple                            adenomas including "large adenoma" 2005                           February 2022 4 adenomas maximum 35 mm also a 30 mm                            adenoma Recommendation:           - Patient has a contact number available for  emergencies. The signs and symptoms of potential                            delayed complications were  discussed with the                            patient. Return to normal activities tomorrow.                            Written discharge instructions were provided to the                            patient.                           - Resume previous diet.                           - Continue present medications.                           - No aspirin, ibuprofen, naproxen, or other                            non-steroidal anti-inflammatory drugs for 2 weeks                            after polyp removal.                           - Await pathology results.                           - Repeat colonoscopy is recommended for                            surveillance. The colonoscopy date will be                            determined after pathology results from today's                            exam become available for review. WOULD USE                            ABDOMINAL BINDER AGAIN Gatha Mayer, MD 09/20/2021 11:34:00 AM This report has been signed electronically.

## 2021-09-20 NOTE — Progress Notes (Signed)
Pt's states no medical or surgical changes since previsit or office visit.  Vitals DT 

## 2021-09-20 NOTE — Progress Notes (Signed)
Sedate, gd SR, tolerated procedure well, VSS, report to RN 

## 2021-09-20 NOTE — Patient Instructions (Addendum)
I removed a small residual polyp from where the largest polyps were removed last time.  There were 4 other tiny polyps removed.  I will let you know pathology results and when to have another routine colonoscopy by mail and/or My Chart.  I appreciate the opportunity to care for you. Gatha Mayer, MD, FACG   YOU HAD AN ENDOSCOPIC PROCEDURE TODAY AT [facility]:   Refer to the procedure report that was given to you for any specific questions about what was found during the examination.  If the procedure report does not answer your questions, please call your gastroenterologist to clarify.  If you requested that your care partner not be given the details of your procedure findings, then the procedure report has been included in a sealed envelope for you to review at your convenience later.  YOU SHOULD EXPECT: Some feelings of bloating in the abdomen. Passage of more gas than usual.  Walking can help get rid of the air that was put into your GI tract during the procedure and reduce the bloating. If you had a lower endoscopy (such as a colonoscopy or flexible sigmoidoscopy) you may notice spotting of blood in your stool or on the toilet paper. If you underwent a bowel prep for your procedure, you may not have a normal bowel movement for a few days.  Please Note:  You might notice some irritation and congestion in your nose or some drainage.  This is from the oxygen used during your procedure.  There is no need for concern and it should clear up in a day or so.  SYMPTOMS TO REPORT IMMEDIATELY:  Following lower endoscopy (colonoscopy or flexible sigmoidoscopy):  Excessive amounts of blood in the stool  Significant tenderness or worsening of abdominal pains  Swelling of the abdomen that is new, acute  Fever of 100F or higher  For urgent or emergent issues, a gastroenterologist can be reached at any hour by calling [phone]. Do not use MyChart messaging for urgent concerns.     DIET:  We do recommend a small meal at first, but then you may proceed to your regular diet.  Drink plenty of fluids but you should avoid alcoholic beverages for 24 hours.  ACTIVITY:  You should plan to take it easy for the rest of today and you should NOT DRIVE or use heavy machinery until tomorrow (because of the sedation medicines used during the test).    FOLLOW UP: Our staff will call the number listed on your records 48-72 hours following your procedure to check on you and address any questions or concerns that you may have regarding the information given to you following your procedure. If we do not reach you, we will leave a message.  We will attempt to reach you two times.  During this call, we will ask if you have developed any symptoms of COVID 19. If you develop any symptoms (ie: fever, flu-like symptoms, shortness of breath, cough etc.) before then, please call [phone].  If you test positive for Covid 19 in the 2 weeks post procedure, please call and report this information to [place].    If any biopsies were taken you will be contacted by phone or by letter within the next 1-3 weeks.  Please call us at [phone] if you have not heard about the biopsies in 3 weeks.    SIGNATURES/CONFIDENTIALITY: You and/or your care partner have signed paperwork which will be entered into your electronic medical record.  These signatures attest to the fact that that the information above on your After Visit Summary has been reviewed and is understood.  Full responsibility of the confidentiality of this discharge information lies with you and/or your care-partner.  °

## 2021-09-20 NOTE — Progress Notes (Signed)
Called to room to assist during endoscopic procedure.  Patient ID and intended procedure confirmed with present staff. Received instructions for my participation in the procedure from the performing physician.  

## 2021-09-20 NOTE — Progress Notes (Signed)
Only 1 rectal polyp was retrieved.

## 2021-09-24 ENCOUNTER — Telehealth: Payer: Self-pay

## 2021-09-24 NOTE — Telephone Encounter (Signed)
°  Follow up Call-  Call back number 09/20/2021 11/21/2020  Post procedure Call Back phone  # [phone]  Permission to leave phone message Yes Yes  Some recent data might be hidden     Patient questions:  Do you have a fever, pain , or abdominal swelling? No. Pain Score  0 *  Have you tolerated food without any problems? Yes.    Have you been able to return to your normal activities? Yes.    Do you have any questions about your discharge instructions: Diet   No. Medications  No. Follow up visit  No.  Do you have questions or concerns about your Care? No.  Actions: * If pain score is 4 or above: No action needed, pain <4.

## 2021-09-24 NOTE — Telephone Encounter (Signed)
No answer, left message to call back later today, B.Tabetha Haraway RN. 

## 2021-09-25 ENCOUNTER — Encounter: Payer: Self-pay | Admitting: Internal Medicine

## 2021-09-25 DIAGNOSIS — Z86010 Personal history of colonic polyps: Secondary | ICD-10-CM

## 2021-12-13 ENCOUNTER — Telehealth: Payer: Self-pay

## 2021-12-13 NOTE — Telephone Encounter (Signed)
Patient contacted to schedule mammogram.  ? ?RE: Mobile Mammo event located at: ? ?[email or web address]  ?      ?[address]    ?[place] [address]    ? ?Date: April 7th  ? ?Patient stated that she wanted to call [facility] of [facility] to schedule mammogram.  ?Patient stated that she has not had a recent PAP.  ?

## 2022-01-10 ENCOUNTER — Other Ambulatory Visit: Payer: Self-pay | Admitting: Primary Care

## 2022-01-10 DIAGNOSIS — Z1231 Encounter for screening mammogram for malignant neoplasm of breast: Secondary | ICD-10-CM

## 2022-06-14 ENCOUNTER — Telehealth (INDEPENDENT_AMBULATORY_CARE_PROVIDER_SITE_OTHER): Payer: Self-pay | Admitting: Primary Care

## 2022-06-14 ENCOUNTER — Other Ambulatory Visit (INDEPENDENT_AMBULATORY_CARE_PROVIDER_SITE_OTHER): Payer: Self-pay | Admitting: Primary Care

## 2022-06-14 DIAGNOSIS — I10 Essential (primary) hypertension: Secondary | ICD-10-CM

## 2022-06-14 DIAGNOSIS — Z760 Encounter for issue of repeat prescription: Secondary | ICD-10-CM

## 2022-06-14 NOTE — Telephone Encounter (Signed)
Medication Refill - Medication: Patient completely out and would like a short supply of amLODipine (NORVASC) 10 MG tablet and hydrochlorothiazide (HYDRODIURIL) 25 MG tablet to hold her over until her 07/08/2022 appointment  Has the patient contacted their pharmacy? Yes.    (Agent: If yes, when and what did the pharmacy advise?) Contact PCP office  Preferred Pharmacy (with phone number or street name):   [facility] [phone] - [place], [address] Phone:  [phone]  Fax:  [phone]     Has the patient been seen for an appointment in the last year OR does the patient have an upcoming appointment? Yes.    Agent: Please be advised that RX refills may take up to 3 business days. We ask that you follow-up with your pharmacy.

## 2022-06-14 NOTE — Telephone Encounter (Addendum)
Patient requesting diagnostic mamo orders due to knot found on her left breast. Please advise patient with a follow up call

## 2022-06-17 MED ORDER — HYDROCHLOROTHIAZIDE 25 MG PO TABS: ORAL_TABLET | ORAL | 0 refills | Status: DC

## 2022-06-17 MED ORDER — AMLODIPINE BESYLATE 10 MG PO TABS: 10.0000 mg | ORAL_TABLET | Freq: Every day | ORAL | 0 refills | Status: DC

## 2022-06-17 NOTE — Telephone Encounter (Signed)
Will forward to provider  

## 2022-06-17 NOTE — Telephone Encounter (Signed)
Courtesy refill. No valid encounter . Last labs 08/28/21. Future visit in 3 weeks.  Requested Prescriptions  Pending Prescriptions Disp Refills  . amLODipine (NORVASC) 10 MG tablet 22 tablet 0    Sig: Take 1 tablet (10 mg total) by mouth daily.     Cardiovascular: Calcium Channel Blockers 2 Failed - 06/14/2022  9:06 AM      Failed - Valid encounter within last 6 months    Recent Outpatient Visits          9 months ago Tobacco use disorder   [place], [place], NP   1 year ago Essential hypertension   [place] Fenton Foy, NP   1 year ago Essential hypertension   [place], [place], NP   2 years ago Essential hypertension   [place], [place], NP   3 years ago Essential hypertension   [place], Michelle P, NP      Future Appointments            In 3 weeks Oletta Lamas, [place] Cage, NP [place] BP in normal range    BP Readings from Last 1 Encounters:  09/20/21 138/76         Passed - Last Heart Rate in normal range    Pulse Readings from Last 1 Encounters:  09/20/21 67         . hydrochlorothiazide (HYDRODIURIL) 25 MG tablet 22 tablet 0    Sig: Take 1 tablet daily in AM     Cardiovascular: Diuretics - Thiazide Failed - 06/14/2022  9:06 AM      Failed - Cr in normal range and within 180 days    Creatinine, Ser  Date Value Ref Range Status  08/28/2021 0.93 0.57 - 1.00 mg/dL Final         Failed - K in normal range and within 180 days    Potassium  Date Value Ref Range Status  08/28/2021 3.7 3.5 - 5.2 mmol/L Final         Failed - Na in normal range and within 180 days    Sodium  Date Value Ref Range Status  08/28/2021 137 134 - 144 mmol/L Final         Failed - Valid encounter within last 6 months    Recent Outpatient Visits          9 months  ago Tobacco use disorder   [place] Kerin Perna, NP   1 year ago Essential hypertension   [place] Fenton Foy, NP   1 year ago Essential hypertension   [place], Michelle P, NP   2 years ago Essential hypertension   [place] Kerin Perna, NP   3 years ago Essential hypertension   [place] Kerin Perna, NP      Future Appointments            In 3 weeks Kerin Perna, NP [place] - Last BP in normal range    BP Readings from Last 1 Encounters:  09/20/21 138/76

## 2022-06-19 ENCOUNTER — Encounter (INDEPENDENT_AMBULATORY_CARE_PROVIDER_SITE_OTHER): Payer: Self-pay

## 2022-06-19 NOTE — Telephone Encounter (Signed)
Pt is scheduled for 9/20 at 1010am

## 2022-06-26 ENCOUNTER — Encounter (INDEPENDENT_AMBULATORY_CARE_PROVIDER_SITE_OTHER): Payer: Self-pay | Admitting: Primary Care

## 2022-06-26 ENCOUNTER — Ambulatory Visit (INDEPENDENT_AMBULATORY_CARE_PROVIDER_SITE_OTHER): Payer: 59 | Admitting: Primary Care

## 2022-06-26 VITALS — BP 152/100 | HR 96 | Resp 16 | Wt 183.2 lb

## 2022-06-26 DIAGNOSIS — I10 Essential (primary) hypertension: Secondary | ICD-10-CM | POA: Diagnosis not present

## 2022-06-26 DIAGNOSIS — N6019 Diffuse cystic mastopathy of unspecified breast: Secondary | ICD-10-CM

## 2022-06-26 DIAGNOSIS — Z760 Encounter for issue of repeat prescription: Secondary | ICD-10-CM

## 2022-06-27 ENCOUNTER — Encounter (INDEPENDENT_AMBULATORY_CARE_PROVIDER_SITE_OTHER): Payer: Self-pay | Admitting: Primary Care

## 2022-06-27 MED ORDER — AMLODIPINE BESYLATE 10 MG PO TABS: 10.0000 mg | ORAL_TABLET | Freq: Every day | ORAL | 1 refills | Status: DC

## 2022-06-27 MED ORDER — HYDROCHLOROTHIAZIDE 25 MG PO TABS: ORAL_TABLET | ORAL | 1 refills | Status: DC

## 2022-06-27 NOTE — Progress Notes (Signed)
Acute Office Visit  Subjective:     Patient ID: Yvette Davis, female    DOB: 1961-05-07, 61 y.o.   MRN: [id number]  Chief Complaint  Patient presents with   Cyst    Left breast    Medication Refill    Medication Refill   Yvette Davis is a 61 year old obese female who voices concerns with feeling something in her left breast when asked location using a clock 7:00.  The area is not swollen or painful just concerned when she found it.  Her blood pressure is elevated she is requesting refills on her blood pressure medication. Patient has No headache, No chest pain, No abdominal pain - No Nausea, No new weakness tingling or numbness, No Cough - shortness of breath   ROS  Comprehensive ROS Pertinent positive and negative noted in HPI     Objective:    BP (!) 152/100   Pulse 96   Resp 16   Wt 183 lb 3.2 oz (83.1 kg)   SpO2 96%   BMI 31.45 kg/m  BP Readings from Last 3 Encounters:  06/26/22 (!) 152/100  09/20/21 138/76  08/28/21 (!) 145/90      Physical Exam Vitals reviewed.  Constitutional:      Appearance: She is obese.  HENT:     Head: Normocephalic.     Right Ear: Tympanic membrane and external ear normal.     Left Ear: Tympanic membrane and external ear normal.     Nose: Nose normal.  Eyes:     Extraocular Movements: Extraocular movements intact.  Cardiovascular:     Rate and Rhythm: Normal rate and regular rhythm.  Pulmonary:     Effort: Pulmonary effort is normal.     Breath sounds: Normal breath sounds.  Abdominal:     General: Bowel sounds are normal. There is distension.     Palpations: Abdomen is soft.  Musculoskeletal:        General: Normal range of motion.     Cervical back: Normal range of motion and neck supple.  Skin:    Comments: Breasts - breasts appear normal,  masses felt bilaterally ( fibroid cystic breast) , no skin or nipple changes or axillary nodes Educated patient on proper self breast examination and had patient to  demonstrate SBE.   Neurological:     Mental Status: She is alert and oriented to person, place, and time.  Psychiatric:        Mood and Affect: Mood normal.        Behavior: Behavior normal.    No results found for any visits on 06/26/22.    Assessment & Plan:  [name] was seen today for cyst and medication refill.  Diagnoses and all orders for this visit:  Fibrocystic breast changes, unspecified laterality -     MM Digital Diagnostic Bilat; Future  Medication refill -     hydrochlorothiazide (HYDRODIURIL) 25 MG tablet; Take 1 tablet daily in AM -     amLODipine (NORVASC) 10 MG tablet; Take 1 tablet (10 mg total) by mouth daily.  Essential hypertension BP goal - < 140/90 Explained that having normal blood pressure is the goal and medications are helping to get to goal and maintain normal blood pressure. DIET: Limit salt intake, read nutrition labels to check salt content, limit fried and high fatty foods  Avoid using multisymptom OTC cold preparations that generally contain sudafed which can rise BP. Consult with pharmacist on best cold relief products to use  for persons with HTN EXERCISE Discussed incorporating exercise such as walking - 30 minutes most days of the week and can do in 10 minute intervals    -     hydrochlorothiazide (HYDRODIURIL) 25 MG tablet; Take 1 tablet daily in AM -     amLODipine (NORVASC) 10 MG tablet; Take 1 tablet (10 mg total) by mouth daily.    Meds ordered this encounter  Medications   hydrochlorothiazide (HYDRODIURIL) 25 MG tablet    Sig: Take 1 tablet daily in AM    Dispense:  90 tablet    Refill:  1    Order Specific Question:   Supervising Provider    Answer:   Tresa Garter [[id number]]   amLODipine (NORVASC) 10 MG tablet    Sig: Take 1 tablet (10 mg total) by mouth daily.    Dispense:  90 tablet    Refill:  1    Order Specific Question:   Supervising Provider    Answer:   Tresa Garter [id number]    No follow-ups on  file.  Kerin Perna, NP

## 2022-07-02 ENCOUNTER — Other Ambulatory Visit (INDEPENDENT_AMBULATORY_CARE_PROVIDER_SITE_OTHER): Payer: Self-pay | Admitting: Primary Care

## 2022-07-02 DIAGNOSIS — N6019 Diffuse cystic mastopathy of unspecified breast: Secondary | ICD-10-CM

## 2022-07-06 ENCOUNTER — Emergency Department (HOSPITAL_COMMUNITY)
Admission: EM | Admit: 2022-07-06 | Discharge: 2022-07-06 | Disposition: A | Discharge status: Home / Self Care | Payer: 59 | Attending: Emergency Medicine | Admitting: Emergency Medicine

## 2022-07-06 ENCOUNTER — Other Ambulatory Visit: Payer: Self-pay

## 2022-07-06 DIAGNOSIS — W19XXXA Unspecified fall, initial encounter: Secondary | ICD-10-CM | POA: Insufficient documentation

## 2022-07-06 DIAGNOSIS — M25561 Pain in right knee: Secondary | ICD-10-CM | POA: Insufficient documentation

## 2022-07-06 DIAGNOSIS — R296 Repeated falls: Secondary | ICD-10-CM | POA: Insufficient documentation

## 2022-07-06 DIAGNOSIS — M25562 Pain in left knee: Secondary | ICD-10-CM | POA: Insufficient documentation

## 2022-07-06 DIAGNOSIS — Z79899 Other long term (current) drug therapy: Secondary | ICD-10-CM | POA: Insufficient documentation

## 2022-07-06 NOTE — ED Notes (Signed)
Pt refused any work up, states she has no pain and wants to go home. Pt is aox4 and does not want any images or blood work done.

## 2022-07-06 NOTE — Discharge Instructions (Addendum)
You were seen today due to a fall.  If you develop any life-threatening symptoms please return to the emergency department for evaluation.  Information on fall prevention in the home is attached.

## 2022-07-06 NOTE — ED Provider Notes (Signed)
[facility] EMERGENCY DEPARTMENT Provider Note   CSN: [id number] Arrival date & time: 07/06/22  2013     History  Chief Complaint  Patient presents with   Yvette Davis is a 61 y.o. female.  Patient brought to the hospital by her son with concerns over frequent falls.  Patient fell reportedly 3 times today.  Patient states she fell because her knees hurt due to osteoarthritis.  She states that she was post to have surgery on her knees prior to COVID but decided that she did not want it after COVID subsided.  The patient denies any complaints at this time.  She does endorse hitting her head but denies loss of consciousness.  Denies pain to the head.  Denies any other complaints.  Medical history significant for hypertension  HPI     Home Medications Prior to Admission medications   Medication Sig Start Date End Date Taking? Authorizing Provider  amLODipine (NORVASC) 10 MG tablet Take 1 tablet (10 mg total) by mouth daily. 06/27/22   Kerin Perna, NP  hydrochlorothiazide (HYDRODIURIL) 25 MG tablet Take 1 tablet daily in AM 06/27/22   Kerin Perna, NP      Allergies    Meperidine hcl, Morphine, Penicillins, and Sulfonamide derivatives    Review of Systems   Review of Systems  Musculoskeletal:  Positive for arthralgias (Bilateral knee pain).    Physical Exam Updated Vital Signs BP 126/77 (BP Location: Left Arm)   Pulse 62   Temp 97.9 F (36.6 C) (Oral)   Resp 17   SpO2 99%  Physical Exam Vitals and nursing note reviewed.  Constitutional:      General: She is not in acute distress.    Appearance: She is well-developed.  HENT:     Head: Normocephalic and atraumatic.  Eyes:     Conjunctiva/sclera: Conjunctivae normal.  Cardiovascular:     Rate and Rhythm: Normal rate.  Pulmonary:     Effort: Pulmonary effort is normal.  Abdominal:     General: Abdomen is flat.  Musculoskeletal:        General: No swelling or tenderness.      Cervical back: Neck supple.  Skin:    General: Skin is warm and dry.     Capillary Refill: Capillary refill takes less than 2 seconds.  Neurological:     Mental Status: She is alert.  Psychiatric:        Mood and Affect: Mood normal.     ED Results / Procedures / Treatments   Labs (all labs ordered are listed, but only abnormal results are displayed) Labs Reviewed - No data to display  EKG None  Radiology No results found.  Procedures Procedures    Medications Ordered in ED Medications - No data to display  ED Course/ Medical Decision Making/ A&P                           Medical Decision Making  Patient presents to the hospital after multiple falls.  Patient refuses any care at this time.  She is alert and oriented to person place event and time.  She appears to have capacity to make medical decisions.  I discussed ordering a head CT due to the fall and basic labs to evaluate for any abnormalities.  I recommended an EKG.  Patient declined any and all interventions and evaluation.  I explained to the patient signed that  I was unable to require any care at this time.  The patient has medical capacity to make decisions.  Patient discharged home with return precautions        Final Clinical Impression(s) / ED Diagnoses Final diagnoses:  Fall, initial encounter    Rx / DC Orders ED Discharge Orders     None         Ronny Bacon 07/06/22 2041    Fredia Sorrow, MD 07/16/22 1009

## 2022-07-06 NOTE — ED Triage Notes (Addendum)
Pt here from home after having multiple falls today. Pt reports she has osteoarthritis in both knees and got up too quickly and her knees gave out. Pt did fall and hit posterior head, no LOC. Pt not on any blood thinners, denies any pain.

## 2022-07-08 ENCOUNTER — Ambulatory Visit (INDEPENDENT_AMBULATORY_CARE_PROVIDER_SITE_OTHER): Payer: 59 | Admitting: Primary Care

## 2022-09-26 ENCOUNTER — Ambulatory Visit (INDEPENDENT_AMBULATORY_CARE_PROVIDER_SITE_OTHER): Payer: 59 | Admitting: Primary Care

## 2022-10-24 ENCOUNTER — Ambulatory Visit (INDEPENDENT_AMBULATORY_CARE_PROVIDER_SITE_OTHER): Payer: 59 | Admitting: Primary Care

## 2023-01-11 ENCOUNTER — Other Ambulatory Visit (INDEPENDENT_AMBULATORY_CARE_PROVIDER_SITE_OTHER): Payer: Self-pay | Admitting: Primary Care

## 2023-01-11 DIAGNOSIS — Z760 Encounter for issue of repeat prescription: Secondary | ICD-10-CM

## 2023-01-11 DIAGNOSIS — I10 Essential (primary) hypertension: Secondary | ICD-10-CM

## 2024-04-02 ENCOUNTER — Other Ambulatory Visit (HOSPITAL_COMMUNITY): Payer: Self-pay

## 2024-05-05 ENCOUNTER — Telehealth (INDEPENDENT_AMBULATORY_CARE_PROVIDER_SITE_OTHER): Payer: Self-pay | Admitting: Primary Care

## 2024-05-05 NOTE — Telephone Encounter (Signed)
 Called pt to confirm appt. LVM for pt

## 2024-05-06 ENCOUNTER — Ambulatory Visit (INDEPENDENT_AMBULATORY_CARE_PROVIDER_SITE_OTHER): Admitting: Primary Care

## 2024-05-06 ENCOUNTER — Encounter (INDEPENDENT_AMBULATORY_CARE_PROVIDER_SITE_OTHER): Payer: Self-pay | Admitting: Primary Care

## 2024-05-06 VITALS — BP 150/96 | HR 97 | Resp 16 | Ht 64.0 in | Wt 186.6 lb

## 2024-05-06 DIAGNOSIS — Z13220 Encounter for screening for lipoid disorders: Secondary | ICD-10-CM | POA: Diagnosis not present

## 2024-05-06 DIAGNOSIS — E559 Vitamin D deficiency, unspecified: Secondary | ICD-10-CM | POA: Diagnosis not present

## 2024-05-06 DIAGNOSIS — I10 Essential (primary) hypertension: Secondary | ICD-10-CM

## 2024-05-06 DIAGNOSIS — Z760 Encounter for issue of repeat prescription: Secondary | ICD-10-CM

## 2024-05-06 DIAGNOSIS — Z2821 Immunization not carried out because of patient refusal: Secondary | ICD-10-CM

## 2024-05-06 DIAGNOSIS — R7309 Other abnormal glucose: Secondary | ICD-10-CM

## 2024-05-06 DIAGNOSIS — Z1231 Encounter for screening mammogram for malignant neoplasm of breast: Secondary | ICD-10-CM

## 2024-05-06 MED ORDER — AMLODIPINE BESYLATE 10 MG PO TABS: 10.0000 mg | ORAL_TABLET | Freq: Every day | ORAL | 0 refills | Status: DC

## 2024-05-06 MED ORDER — HYDROCHLOROTHIAZIDE 25 MG PO TABS: ORAL_TABLET | ORAL | 0 refills | Status: DC

## 2024-05-06 MED ORDER — HYDRALAZINE HCL 25 MG PO TABS: 25.0000 mg | ORAL_TABLET | Freq: Once | ORAL | Status: AC

## 2024-05-06 NOTE — Progress Notes (Signed)
 [facility]   Yvette Davis is a 63 y.o. female presents for hypertension evaluation, Denies shortness of breath, headaches, chest pain or lower extremity edema, sudden onset, vision changes, unilateral weakness, dizziness, paresthesias  Patient has blood pressure is elevated 161/102 she is determined to go to work today.  Will treat at this appointment with hydralazine  25 recheck her blood pressure if improved she will go to work.  Patient reports adherence with medications. She has been out of Bp meds 2 weeks.   Past Medical History:  Diagnosis Date   Anemia    past hx    Arthritis    Endometriosis    Hx of adenomatous colonic polyps including advanced adenomas large sessile lesions 12/07/2020   Hypertension    Past Surgical History:  Procedure Laterality Date   ABDOMINAL HYSTERECTOMY     CHOLECYSTECTOMY     CHOLECYSTECTOMY     COLONOSCOPY     Allergies  Allergen Reactions   Meperidine Hcl     REACTION: delirious   Morphine     REACTION: delirious   Penicillins     REACTION: throat closes   Sulfonamide Derivatives     REACTION: itching   No current outpatient medications on file prior to visit.   No current facility-administered medications on file prior to visit.   Social History   Socioeconomic History   Marital status: Single    Spouse name: Not on file   Number of children: Not on file   Years of education: Not on file   Highest education level: Not on file  Occupational History   Not on file  Tobacco Use   Smoking status: Every Day    Current packs/day: 0.25    Average packs/day: 0.3 packs/day for 31.0 years (7.8 ttl pk-yrs)    Types: Cigarettes   Smokeless tobacco: Never   Tobacco comments:    Started at age 69, quit for a short time  Vaping Use   Vaping status: Never Used  Substance and Sexual Activity   Alcohol use: Yes    Alcohol/week: 8.0 standard drinks of alcohol    Types: 8 Cans of beer per week    Comment: Drinks  beer on weekend   Drug use: Never   Sexual activity: Never  Other Topics Concern   Not on file  Social History Narrative   Not on file   Social Drivers of Health   Financial Resource Strain: Not on file  Food Insecurity: No Food Insecurity (05/06/2024)   Hunger Vital Sign    Worried About Running Out of Food in the Last Year: Never true    Ran Out of Food in the Last Year: Never true  Transportation Needs: No Transportation Needs (05/06/2024)   PRAPARE - Administrator, Civil Service (Medical): No    Lack of Transportation (Non-Medical): No  Physical Activity: Not on file  Stress: Not on file  Social Connections: Not on file  Intimate Partner Violence: Not At Risk (05/06/2024)   Humiliation, Afraid, Rape, and Kick questionnaire    Fear of Current or Ex-Partner: No    Emotionally Abused: No    Physically Abused: No    Sexually Abused: No   Family History  Problem Relation Age of Onset   Stomach cancer Mother    Colon cancer Maternal Uncle    Colon polyps Neg Hx    Esophageal cancer Neg Hx    Rectal cancer Neg Hx    Health Maintenance  Topic Date Due   Pneumococcal Vaccine: 50+ Years (1 of 2 - PCV) Never done   Cervical Cancer Screening (HPV/Pap Cotest)  Never done   MAMMOGRAM  08/24/2010   COVID-19 Vaccine (1 - 2024-25 season) Never done   Zoster Vaccines- Shingrix (1 of 2) 08/06/2024 (Originally 02/09/2010)   DTaP/Tdap/Td (1 - Tdap) 05/06/2025 (Originally 02/10/1979)   Pneumococcal Vaccine: 19-49 Years (1 of 2 - PCV) 05/06/2025 (Originally 02/10/1979)   INFLUENZA VACCINE  05/07/2024   Colonoscopy  09/20/2024   Hepatitis C Screening  Completed   HIV Screening  Completed   Hepatitis B Vaccines  Aged Out   HPV VACCINES  Aged Out   Meningococcal B Vaccine  Aged Out     OBJECTIVE:  Vitals:   05/06/24 0949 05/06/24 0950 05/06/24 1051  BP: (!) 167/95 (!) 161/102 (!) 150/96  Pulse: 97    Resp: 16    SpO2: 98%    Weight: 186 lb 9.6 oz (84.6 kg)    Height: 5'  4 (1.626 m)      Physical Exam Vitals reviewed.  Constitutional:      Appearance: Normal appearance. She is obese.  HENT:     Head: Normocephalic.     Right Ear: Tympanic membrane, ear canal and external ear normal.     Left Ear: Tympanic membrane, ear canal and external ear normal.     Nose: Nose normal.     Mouth/Throat:     Mouth: Mucous membranes are moist.  Eyes:     Extraocular Movements: Extraocular movements intact.     Pupils: Pupils are equal, round, and reactive to light.  Cardiovascular:     Rate and Rhythm: Normal rate and regular rhythm.  Pulmonary:     Effort: Pulmonary effort is normal.     Breath sounds: Normal breath sounds.  Abdominal:     General: Bowel sounds are normal.     Palpations: Abdomen is soft.  Musculoskeletal:        General: Normal range of motion.     Cervical back: Normal range of motion.  Skin:    General: Skin is warm and dry.  Neurological:     Mental Status: She is alert and oriented to person, place, and time.  Psychiatric:        Mood and Affect: Mood normal.        Behavior: Behavior normal.        Thought Content: Thought content normal.    ROS  Last 3 Office BP readings: BP Readings from Last 3 Encounters:  05/06/24 (!) 150/96  07/06/22 126/77  06/26/22 (!) 152/100    BMET    Component Value Date/Time   NA 137 08/28/2021 0916   K 3.7 08/28/2021 0916   CL 94 (L) 08/28/2021 0916   CO2 26 08/28/2021 0916   GLUCOSE 109 (H) 08/28/2021 0916   GLUCOSE 96 06/09/2018 1140   BUN 7 (L) 08/28/2021 0916   CREATININE 0.93 08/28/2021 0916   CALCIUM 9.9 08/28/2021 0916   GFRNONAA 84 08/04/2020 1123   GFRAA 97 08/04/2020 1123    Renal function: CrCl cannot be calculated (Patient's most recent lab result is older than the maximum 21 days allowed.).  Clinical ASCVD: Yes  The 10-year ASCVD risk score (Arnett DK, et al., 2019) is: 19%   Values used to calculate the score:     Age: 63 years     Clincally relevant sex:  Female     Is Non-Hispanic African American: Yes  Diabetic: No     Tobacco smoker: Yes     Systolic Blood Pressure: 150 mmHg     Is BP treated: Yes     HDL Cholesterol: 52 mg/dL     Total Cholesterol: 151 mg/dL  ASCVD risk factors include- [place]   ASSESSMENT & PLAN: Yvette Davis was seen today for hypertension.  Diagnoses and all orders for this visit:  Elevated glucose -     Cancel: CMP14+EGFR -     Cancel: Lipid panel -     Cancel: Hemoglobin A1c -     CMP14+EGFR; Future -     Hemoglobin A1c; Future -     Lipid panel; Future  Essential hypertension BP goal - < 140/90 Explained that having normal blood pressure is the goal and medications are helping to get to goal and maintain normal blood pressure. DIET: Limit salt intake, read nutrition labels to check salt content, limit fried and high fatty foods  Avoid using multisymptom OTC cold preparations that generally contain sudafed which can rise BP. Consult with pharmacist on best cold relief products to use for persons with HTN EXERCISE Discussed incorporating exercise such as walking - 30 minutes most days of the week and can do in 10 minute intervals    -     Cancel: CBC with Differential/Platelet -     Cancel: CMP14+EGFR -     CBC with Differential/Platelet; Future -     CMP14+EGFR; Future -     amLODipine  (NORVASC ) 10 MG tablet; Take 1 tablet (10 mg total) by mouth daily. -     hydrochlorothiazide  (HYDRODIURIL ) 25 MG tablet; TAKE 1 TABLET BY MOUTH DAILY IN THE MORNING -     hydrALAZINE  (APRESOLINE ) tablet 25 mg  Screening cholesterol level -     Cancel: Lipid panel -     Lipid panel; Future  Vitamin D  deficiency Not taking OTC 2000iu daily -     Cancel: VITAMIN D  25 Hydroxy (Vit-D Deficiency, Fractures) -     VITAMIN D  25 Hydroxy (Vit-D Deficiency, Fractures); Future  Medication refill -     amLODipine  (NORVASC ) 10 MG tablet; Take 1 tablet (10 mg total) by mouth daily. -     hydrochlorothiazide  (HYDRODIURIL ) 25 MG  tablet; TAKE 1 TABLET BY MOUTH DAILY IN THE MORNING  Herpes zoster vaccination declined  Tetanus, diphtheria, and acellular pertussis (Tdap) vaccination declined  Pneumococcal vaccination declined  Encounter for screening mammogram for malignant neoplasm of breast -     MM 3D SCREENING MAMMOGRAM BILATERAL BREAST; Future    Meds ordered this encounter  Medications   amLODipine  (NORVASC ) 10 MG tablet    Sig: Take 1 tablet (10 mg total) by mouth daily.    Dispense:  30 tablet    Refill:  0   hydrochlorothiazide  (HYDRODIURIL ) 25 MG tablet    Sig: TAKE 1 TABLET BY MOUTH DAILY IN THE MORNING    Dispense:  30 tablet    Refill:  0   hydrALAZINE  (APRESOLINE ) tablet 25 mg     This note has been created with Education officer, environmental. Any transcriptional errors are unintentional.   Rosaline SHAUNNA Bohr, NP 05/06/2024, 2:52 PM

## 2024-05-07 ENCOUNTER — Ambulatory Visit: Payer: Self-pay | Admitting: Primary Care

## 2024-05-07 LAB — CMP14+EGFR
ALT: 16 IU/L (ref 0–32)
AST: 49 IU/L — ABNORMAL HIGH (ref 0–40)
Albumin: 4 g/dL (ref 3.9–4.9)
Alkaline Phosphatase: 142 IU/L — ABNORMAL HIGH (ref 44–121)
BUN/Creatinine Ratio: 7 — ABNORMAL LOW (ref 12–28)
BUN: 6 mg/dL — ABNORMAL LOW (ref 8–27)
Bilirubin Total: 0.7 mg/dL (ref 0.0–1.2)
CO2: 23 mmol/L (ref 20–29)
Calcium: 9.2 mg/dL (ref 8.7–10.3)
Chloride: 99 mmol/L (ref 96–106)
Creatinine, Ser: 0.81 mg/dL (ref 0.57–1.00)
Globulin, Total: 3.9 g/dL (ref 1.5–4.5)
Glucose: 86 mg/dL (ref 70–99)
Potassium: 4.6 mmol/L (ref 3.5–5.2)
Sodium: 137 mmol/L (ref 134–144)
Total Protein: 7.9 g/dL (ref 6.0–8.5)
eGFR: 81 mL/min/1.73 (ref >59)

## 2024-05-07 LAB — CBC WITH DIFFERENTIAL/PLATELET

## 2024-05-07 LAB — VITAMIN D 25 HYDROXY (VIT D DEFICIENCY, FRACTURES): Vit D, 25-Hydroxy: 14.8 ng/mL — ABNORMAL LOW (ref 30.0–100.0)

## 2024-05-07 LAB — LIPID PANEL
Chol/HDL Ratio: 3.1 ratio (ref 0.0–4.4)
Cholesterol, Total: 150 mg/dL (ref 100–199)
HDL: 49 mg/dL (ref >39)
LDL Chol Calc (NIH): 80 mg/dL (ref 0–99)
Triglycerides: 118 mg/dL (ref 0–149)
VLDL Cholesterol Cal: 21 mg/dL (ref 5–40)

## 2024-05-07 LAB — HEMOGLOBIN A1C

## 2024-05-10 ENCOUNTER — Other Ambulatory Visit (INDEPENDENT_AMBULATORY_CARE_PROVIDER_SITE_OTHER): Payer: Self-pay | Admitting: Primary Care

## 2024-05-10 ENCOUNTER — Other Ambulatory Visit: Payer: Self-pay

## 2024-05-10 DIAGNOSIS — N6019 Diffuse cystic mastopathy of unspecified breast: Secondary | ICD-10-CM

## 2024-05-10 MED ORDER — VITAMIN D (ERGOCALCIFEROL) 1.25 MG (50000 UNIT) PO CAPS
50000.0000 [IU] | ORAL_CAPSULE | ORAL | 0 refills | Status: AC
  Filled 2024-05-10: qty 10, 70d supply, fill #0

## 2024-05-18 ENCOUNTER — Other Ambulatory Visit: Payer: Self-pay

## 2024-05-19 ENCOUNTER — Other Ambulatory Visit: Payer: Self-pay

## 2024-05-19 ENCOUNTER — Other Ambulatory Visit (INDEPENDENT_AMBULATORY_CARE_PROVIDER_SITE_OTHER): Payer: Self-pay | Admitting: Primary Care

## 2024-05-19 DIAGNOSIS — N6019 Diffuse cystic mastopathy of unspecified breast: Secondary | ICD-10-CM

## 2024-05-27 ENCOUNTER — Other Ambulatory Visit (INDEPENDENT_AMBULATORY_CARE_PROVIDER_SITE_OTHER): Payer: Self-pay | Admitting: Primary Care

## 2024-05-27 DIAGNOSIS — N6019 Diffuse cystic mastopathy of unspecified breast: Secondary | ICD-10-CM

## 2024-05-27 DIAGNOSIS — N63 Unspecified lump in unspecified breast: Secondary | ICD-10-CM

## 2024-06-01 ENCOUNTER — Ambulatory Visit
Admission: RE | Admit: 2024-06-01 | Discharge: 2024-06-01 | Disposition: A | Discharge status: Home / Self Care | Source: Ambulatory Visit | Attending: Primary Care | Admitting: Primary Care

## 2024-06-01 ENCOUNTER — Other Ambulatory Visit: Payer: Self-pay | Admitting: Primary Care

## 2024-06-01 DIAGNOSIS — N6320 Unspecified lump in the left breast, unspecified quadrant: Secondary | ICD-10-CM

## 2024-06-01 DIAGNOSIS — N63 Unspecified lump in unspecified breast: Secondary | ICD-10-CM

## 2024-06-01 DIAGNOSIS — N6019 Diffuse cystic mastopathy of unspecified breast: Secondary | ICD-10-CM

## 2024-06-03 ENCOUNTER — Encounter: Payer: Self-pay | Admitting: Primary Care

## 2024-06-03 ENCOUNTER — Ambulatory Visit (INDEPENDENT_AMBULATORY_CARE_PROVIDER_SITE_OTHER): Admitting: Primary Care

## 2024-06-03 ENCOUNTER — Encounter (INDEPENDENT_AMBULATORY_CARE_PROVIDER_SITE_OTHER): Payer: Self-pay | Admitting: Primary Care

## 2024-06-03 DIAGNOSIS — Z760 Encounter for issue of repeat prescription: Secondary | ICD-10-CM

## 2024-06-03 DIAGNOSIS — I10 Essential (primary) hypertension: Secondary | ICD-10-CM | POA: Diagnosis not present

## 2024-06-03 MED ORDER — AMLODIPINE BESYLATE 10 MG PO TABS: 10.0000 mg | ORAL_TABLET | Freq: Every day | ORAL | 0 refills | Status: DC

## 2024-06-03 MED ORDER — HYDROCHLOROTHIAZIDE 25 MG PO TABS: ORAL_TABLET | ORAL | 0 refills | Status: DC

## 2024-06-03 NOTE — Progress Notes (Signed)
 [facility]   Yvette Davis is a 63 y.o. female presents for hypertension evaluation, Denies shortness of breath, headaches, chest pain or lower extremity edema, sudden onset, vision changes, unilateral weakness, dizziness, paresthesias   Patient reports adherence with medications.    Past Medical History:  Diagnosis Date   Anemia    past hx    Arthritis    Endometriosis    Hx of adenomatous colonic polyps including advanced adenomas large sessile lesions 12/07/2020   Hypertension    Past Surgical History:  Procedure Laterality Date   ABDOMINAL HYSTERECTOMY     CHOLECYSTECTOMY     CHOLECYSTECTOMY     COLONOSCOPY     Allergies  Allergen Reactions   Meperidine Hcl     REACTION: delirious   Morphine     REACTION: delirious   Penicillins     REACTION: throat closes   Sulfonamide Derivatives     REACTION: itching   Current Outpatient Medications on File Prior to Visit  Medication Sig Dispense Refill   Vitamin D , Ergocalciferol , (DRISDOL ) 1.25 MG (50000 UNIT) CAPS capsule Take 1 capsule (50,000 Units total) by mouth every 7 (seven) days. (Patient not taking: Reported on 06/03/2024) 10 capsule 0   Current Facility-Administered Medications on File Prior to Visit  Medication Dose Route Frequency Provider Last Rate Last Admin   hydrALAZINE  (APRESOLINE ) tablet 25 mg  25 mg Oral Once        Social History   Socioeconomic History   Marital status: Single    Spouse name: Not on file   Number of children: Not on file   Years of education: Not on file   Highest education level: Not on file  Occupational History   Not on file  Tobacco Use   Smoking status: Every Day    Current packs/day: 0.25    Average packs/day: 0.3 packs/day for 31.0 years (7.8 ttl pk-yrs)    Types: Cigarettes   Smokeless tobacco: Never   Tobacco comments:    Started at age 52, quit for a short time  Vaping Use   Vaping status: Never Used  Substance and Sexual Activity   Alcohol  use: Yes    Alcohol/week: 8.0 standard drinks of alcohol    Types: 8 Cans of beer per week    Comment: Drinks beer on weekend   Drug use: Never   Sexual activity: Never  Other Topics Concern   Not on file  Social History Narrative   Not on file   Social Drivers of Health   Financial Resource Strain: Not on file  Food Insecurity: No Food Insecurity (05/06/2024)   Hunger Vital Sign    Worried About Running Out of Food in the Last Year: Never true    Ran Out of Food in the Last Year: Never true  Transportation Needs: No Transportation Needs (05/06/2024)   PRAPARE - Administrator, Civil Service (Medical): No    Lack of Transportation (Non-Medical): No  Physical Activity: Not on file  Stress: Not on file  Social Connections: Not on file  Intimate Partner Violence: Not At Risk (05/06/2024)   Humiliation, Afraid, Rape, and Kick questionnaire    Fear of Current or Ex-Partner: No    Emotionally Abused: No    Physically Abused: No    Sexually Abused: No   Family History  Problem Relation Age of Onset   Stomach cancer Mother    Colon cancer Maternal Uncle    Colon polyps Neg Hx  Esophageal cancer Neg Hx    Rectal cancer Neg Hx    Health Maintenance  Topic Date Due   Cervical Cancer Screening (HPV/Pap Cotest)  Never done   COVID-19 Vaccine (1 - 2024-25 season) Never done   Colonoscopy  09/20/2024   Zoster Vaccines- Shingrix (1 of 2) 08/06/2024 (Originally 02/09/2010)   INFLUENZA VACCINE  01/04/2025 (Originally 05/07/2024)   DTaP/Tdap/Td (1 - Tdap) 05/06/2025 (Originally 02/10/1979)   Pneumococcal Vaccine: 50+ Years (1 of 2 - PCV) 06/03/2025 (Originally [birth year]/10/1979)   MAMMOGRAM  06/01/2026   Hepatitis C Screening  Completed   HIV Screening  Completed   Hepatitis B Vaccines 19-59 Average Risk  Aged Out   HPV VACCINES  Aged Out   Meningococcal B Vaccine  Aged Out     OBJECTIVE:  Vitals:   06/03/24 1617  BP: (!) 146/85  Pulse: 100  Resp: 19  SpO2: 100%  Weight: 190  lb 9.6 oz (86.5 kg)  Height: 5' 4 (1.626 m)    Physical Exam Vitals reviewed.  Constitutional:      Appearance: Normal appearance. She is obese.  HENT:     Head: Normocephalic.     Right Ear: Tympanic membrane, ear canal and external ear normal.     Left Ear: Tympanic membrane, ear canal and external ear normal.     Nose: Nose normal.     Mouth/Throat:     Mouth: Mucous membranes are moist.  Eyes:     Extraocular Movements: Extraocular movements intact.     Pupils: Pupils are equal, round, and reactive to light.  Cardiovascular:     Rate and Rhythm: Normal rate and regular rhythm.  Pulmonary:     Effort: Pulmonary effort is normal.     Breath sounds: Normal breath sounds.  Abdominal:     General: Bowel sounds are normal.     Palpations: Abdomen is soft.  Musculoskeletal:        General: Normal range of motion.     Cervical back: Normal range of motion and neck supple.  Skin:    General: Skin is warm and dry.  Neurological:     Mental Status: She is alert and oriented to person, place, and time.  Psychiatric:        Mood and Affect: Mood normal.        Behavior: Behavior normal.        Thought Content: Thought content normal.      ROS  Last 3 Office BP readings: BP Readings from Last 3 Encounters:  06/03/24 (!) 146/85  05/06/24 (!) 150/96  07/06/22 126/77    BMET    Component Value Date/Time   NA 137 05/06/2024 1032   K 4.6 05/06/2024 1032   CL 99 05/06/2024 1032   CO2 23 05/06/2024 1032   GLUCOSE 86 05/06/2024 1032   GLUCOSE 96 06/09/2018 1140   BUN 6 (L) 05/06/2024 1032   CREATININE 0.81 05/06/2024 1032   CALCIUM 9.2 05/06/2024 1032   GFRNONAA 84 08/04/2020 1123   GFRAA 97 08/04/2020 1123    Renal function: CrCl cannot be calculated (Patient's most recent lab result is older than the maximum 21 days allowed.).  Clinical ASCVD: Yes  The 10-year ASCVD risk score (Arnett DK, et al., 2019) is: 18.1%   Values used to calculate the score:     Age:  89 years     Clincally relevant sex: Female     Is Non-Hispanic African American: Yes     Diabetic: No  Tobacco smoker: Yes     Systolic Blood Pressure: 146 mmHg     Is BP treated: Yes     HDL Cholesterol: 49 mg/dL     Total Cholesterol: 150 mg/dL  ASCVD risk factors include- [place]   ASSESSMENT & PLAN:  Meds ordered this encounter  Medications   amLODipine  (NORVASC ) 10 MG tablet    Sig: Take 1 tablet (10 mg total) by mouth daily.    Dispense:  30 tablet    Refill:  0   hydrochlorothiazide  (HYDRODIURIL ) 25 MG tablet    Sig: TAKE 1 TABLET BY MOUTH DAILY IN THE MORNING    Dispense:  30 tablet    Refill:  0   Jahne was seen today for blood pressure check.  Diagnoses and all orders for this visit:  Medication refill -     amLODipine  (NORVASC ) 10 MG tablet; Take 1 tablet (10 mg total) by mouth daily. -     hydrochlorothiazide  (HYDRODIURIL ) 25 MG tablet; TAKE 1 TABLET BY MOUTH DAILY IN THE MORNING   Essential hypertension -Counseled on lifestyle modifications for blood pressure control including reduced dietary sodium, increased exercise, weight reduction and adequate sleep. Also, educated patient about the risk for cardiovascular events, stroke and heart attack. Also counseled patient about the importance of medication adherence. If you participate in smoking, it is important to stop using tobacco as this will increase the risks associated with uncontrolled blood pressure.   Goal BP:  For patients younger than 60: Goal BP < 130/80. For patients 60 and older: Goal BP < 140/90. For patients with diabetes: Goal BP < 130/80. Your most recent BP: 146/85  Minimize salt intake. Minimize alcohol intake    This note has been created with Education officer, environmental. Any transcriptional errors are unintentional.   Rosaline SHAUNNA Bohr, NP 06/03/2024, 4:51 PM

## 2024-06-25 ENCOUNTER — Other Ambulatory Visit (INDEPENDENT_AMBULATORY_CARE_PROVIDER_SITE_OTHER): Payer: Self-pay | Admitting: *Deleted

## 2024-06-25 DIAGNOSIS — I10 Essential (primary) hypertension: Secondary | ICD-10-CM

## 2024-06-25 DIAGNOSIS — Z760 Encounter for issue of repeat prescription: Secondary | ICD-10-CM

## 2024-06-25 MED ORDER — HYDROCHLOROTHIAZIDE 25 MG PO TABS: ORAL_TABLET | ORAL | 0 refills | Status: DC

## 2024-07-07 ENCOUNTER — Other Ambulatory Visit (INDEPENDENT_AMBULATORY_CARE_PROVIDER_SITE_OTHER): Payer: Self-pay | Admitting: Primary Care

## 2024-07-07 DIAGNOSIS — I10 Essential (primary) hypertension: Secondary | ICD-10-CM

## 2024-07-07 DIAGNOSIS — Z760 Encounter for issue of repeat prescription: Secondary | ICD-10-CM

## 2024-07-09 NOTE — Telephone Encounter (Signed)
 Requested Prescriptions  Pending Prescriptions Disp Refills   amLODipine  (NORVASC ) 10 MG tablet [Pharmacy Med Name: AMLODIPINE  BESYLATE 10MG  TABLETS] 90 tablet 1    Sig: TAKE 1 TABLET(10 MG) BY MOUTH DAILY     Cardiovascular: Calcium Channel Blockers 2 Failed - 07/09/2024  9:56 AM      Failed - Last BP in normal range    BP Readings from Last 1 Encounters:  06/03/24 (!) 146/85         Passed - Last Heart Rate in normal range    Pulse Readings from Last 1 Encounters:  06/03/24 100         Passed - Valid encounter within last 6 months    Recent Outpatient Visits           1 month ago Essential hypertension   [place] [facility] Celestia Rosaline SQUIBB, NP   2 months ago Elevated glucose   [place] [facility] Celestia Rosaline SQUIBB, NP   2 years ago Fibrocystic breast changes, unspecified laterality   [place] [facility] Celestia Rosaline SQUIBB, NP   2 years ago Tobacco use disorder   [place] [facility] Celestia Rosaline SQUIBB, NP   3 years ago Essential hypertension   [place] [facility] Oley Bascom RAMAN, NP

## 2024-08-09 ENCOUNTER — Ambulatory Visit (INDEPENDENT_AMBULATORY_CARE_PROVIDER_SITE_OTHER): Payer: Self-pay | Admitting: Primary Care

## 2024-10-05 ENCOUNTER — Other Ambulatory Visit (INDEPENDENT_AMBULATORY_CARE_PROVIDER_SITE_OTHER): Payer: Self-pay | Admitting: Primary Care

## 2024-10-05 DIAGNOSIS — Z760 Encounter for issue of repeat prescription: Secondary | ICD-10-CM

## 2024-10-05 DIAGNOSIS — I10 Essential (primary) hypertension: Secondary | ICD-10-CM
